# Patient Record
Sex: Female | Born: 1950 | Race: White | Hispanic: No | Marital: Married | Smoking: Never smoker
Health system: Southern US, Community
[De-identification: ages and names within clinical notes are randomized; demographics above are authoritative.]

## PROBLEM LIST (undated history)

## (undated) DIAGNOSIS — I82402 Acute embolism and thrombosis of unspecified deep veins of left lower extremity: Secondary | ICD-10-CM

## (undated) DIAGNOSIS — M79605 Pain in left leg: Secondary | ICD-10-CM

## (undated) DIAGNOSIS — M1990 Unspecified osteoarthritis, unspecified site: Secondary | ICD-10-CM

## (undated) DIAGNOSIS — T8859XA Other complications of anesthesia, initial encounter: Secondary | ICD-10-CM

## (undated) DIAGNOSIS — T4145XA Adverse effect of unspecified anesthetic, initial encounter: Secondary | ICD-10-CM

## (undated) DIAGNOSIS — J189 Pneumonia, unspecified organism: Secondary | ICD-10-CM

## (undated) DIAGNOSIS — I10 Essential (primary) hypertension: Secondary | ICD-10-CM

## (undated) DIAGNOSIS — R7303 Prediabetes: Secondary | ICD-10-CM

## (undated) DIAGNOSIS — E7800 Pure hypercholesterolemia, unspecified: Secondary | ICD-10-CM

## (undated) DIAGNOSIS — R011 Cardiac murmur, unspecified: Secondary | ICD-10-CM

## (undated) HISTORY — DX: Unspecified osteoarthritis, unspecified site: M19.90

## (undated) HISTORY — PX: TUBAL LIGATION: SHX77

## (undated) HISTORY — DX: Cardiac murmur, unspecified: R01.1

## (undated) HISTORY — DX: Pain in left leg: M79.605

## (undated) HISTORY — DX: Prediabetes: R73.03

## (undated) HISTORY — DX: Pure hypercholesterolemia, unspecified: E78.00

## (undated) HISTORY — PX: BACK SURGERY: SHX140

## (undated) HISTORY — DX: Acute embolism and thrombosis of unspecified deep veins of left lower extremity: I82.402

## (undated) HISTORY — DX: Essential (primary) hypertension: I10

---

## 1997-12-18 ENCOUNTER — Ambulatory Visit (HOSPITAL_COMMUNITY): Admission: RE | Admit: 1997-12-18 | Discharge: 1997-12-18 | Payer: Self-pay | Admitting: Emergency Medicine

## 2008-03-10 HISTORY — PX: KNEE SURGERY: SHX244

## 2008-05-23 ENCOUNTER — Emergency Department (HOSPITAL_COMMUNITY): Admission: EM | Admit: 2008-05-23 | Discharge: 2008-05-23 | Payer: Self-pay | Admitting: Emergency Medicine

## 2008-06-08 DIAGNOSIS — I82402 Acute embolism and thrombosis of unspecified deep veins of left lower extremity: Secondary | ICD-10-CM

## 2008-06-08 HISTORY — DX: Acute embolism and thrombosis of unspecified deep veins of left lower extremity: I82.402

## 2008-06-14 ENCOUNTER — Ambulatory Visit (HOSPITAL_BASED_OUTPATIENT_CLINIC_OR_DEPARTMENT_OTHER): Admission: RE | Admit: 2008-06-14 | Discharge: 2008-06-14 | Payer: Self-pay | Admitting: Orthopedic Surgery

## 2008-06-23 ENCOUNTER — Ambulatory Visit: Payer: Self-pay | Admitting: Vascular Surgery

## 2008-06-23 ENCOUNTER — Encounter (INDEPENDENT_AMBULATORY_CARE_PROVIDER_SITE_OTHER): Payer: Self-pay | Admitting: Orthopedic Surgery

## 2008-06-23 ENCOUNTER — Ambulatory Visit (HOSPITAL_COMMUNITY): Admission: RE | Admit: 2008-06-23 | Discharge: 2008-06-23 | Payer: Self-pay | Admitting: Orthopedic Surgery

## 2010-06-19 LAB — BASIC METABOLIC PANEL
CO2: 32 mEq/L (ref 19–32)
Chloride: 103 mEq/L (ref 96–112)
Glucose, Bld: 93 mg/dL (ref 70–99)
Potassium: 3.9 mEq/L (ref 3.5–5.1)
Sodium: 142 mEq/L (ref 135–145)

## 2010-07-23 NOTE — Op Note (Signed)
NAMEHALLI, EQUIHUA             ACCOUNT NO.:  [id number]   MEDICAL RECORD NO.:  [id number]          PATIENT TYPE:  AMB   LOCATION:  DSC                          FACILITY:  [facility]   PHYSICIAN:  Harvie Junior, M.D.   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   POSTOPERATIVE DIAGNOSIS:  1. 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  2. Medial shelf plica.   PRINCIPAL PROCEDURE:  1. Partial posterior horn medial meniscectomy with corresponding      debridement of medial compartment.  2. Chondroplasty of the patellofemoral compartment.  3. Debridement of medial shelf plica.   SURGEON:  Harvie Junior, MD   ASSISTANT:  Marshia Ly, PA   ANESTHESIA:  General.   BRIEF HISTORY:  [name] [name] is a 60 year old female with history of  having had significant pain in the knee.  We treated conservatively for  a period of time.  MRI was obtained which showed she had a classic  posterior horn medial meniscal tear and after discussion of treatment  options, she ultimately was taken to the operating room for operative  knee arthroscopy and debridement of meniscus as needed.   PROCEDURE:  The patient was taken to the operating room.  After adequate  anesthesia was obtained with general anesthetic, the patient was placed  supine on the operating table.  The left leg was then prepped and draped  in usual sterile fashion.  Following this, routine arthroscopic  examination of knee revealed there is an obvious posterior horn medial  meniscal tear, which was debrided with a straight biting forceps back to  smooth and stable rim.  Once, this was completed, attention was turned  to medial femoral condyle which had some grade 2, 3, and 4 change with a  small area of grade 4 change right in the  extension position, right over  the mid body of the meniscus.  Once this was debrided, there was no  fragment and piece of cartilage seen, this was stable.  Attention was  turned to the ACL, normal.  Attention turned to the lateral side,  normal.  I gained entry into the medial compartment, a large fibrous  medial plica had to be taken down.  This was taken down with combination  of biters and suction shavered to really get to that, I think it was to  the cause of some of this rubbing on the medial condyle.  Once this was  completed, attention was turned to the patellofemoral joint which had  midline track and has no hyper pressure and there was certainly some  chondromalacia of the patellofemoral joint which was debrided.  At this  point, the wound was  copiously and thoroughly irrigated and suctioned dry, the arthroscopic  portals were closed with a bandage.  Sterile compressive dressing was  applied.  The patient was taken to the recovery room and was noted to be  in satisfactory condition.  Estimated  blood loss for this procedure was  none.      Harvie Junior, M.D.  Electronically Signed     JLG/MEDQ  D:  06/14/2008  T:  06/15/2008  Job:  [id number]

## 2010-11-26 ENCOUNTER — Other Ambulatory Visit: Payer: Self-pay | Admitting: Internal Medicine

## 2010-11-26 ENCOUNTER — Ambulatory Visit
Admission: RE | Admit: 2010-11-26 | Discharge: 2010-11-26 | Disposition: A | Discharge status: Home / Self Care | Payer: 59 | Source: Ambulatory Visit | Attending: Internal Medicine | Admitting: Internal Medicine

## 2010-11-26 DIAGNOSIS — R1011 Right upper quadrant pain: Secondary | ICD-10-CM

## 2010-11-27 ENCOUNTER — Encounter (INDEPENDENT_AMBULATORY_CARE_PROVIDER_SITE_OTHER): Payer: Self-pay | Admitting: General Surgery

## 2010-11-27 ENCOUNTER — Ambulatory Visit (INDEPENDENT_AMBULATORY_CARE_PROVIDER_SITE_OTHER): Payer: 59 | Admitting: General Surgery

## 2010-11-27 VITALS — BP 138/86 | HR 60 | Temp 97.8°F | Resp 18 | Ht 66.0 in | Wt 237.4 lb

## 2010-11-27 DIAGNOSIS — K8010 Calculus of gallbladder with chronic cholecystitis without obstruction: Secondary | ICD-10-CM

## 2010-11-27 DIAGNOSIS — I10 Essential (primary) hypertension: Secondary | ICD-10-CM

## 2010-11-27 NOTE — Progress Notes (Signed)
Subjective:     Patient ID: [name] [name], female   DOB: [birth year], 60 y.o.   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 Yesterday morning she awoke with pain in the right upper quadrant her abdomen went to the urgent care and I did evaluation in order to ultrasound of the gallbladder that showed several large stones in her gallbladder. She today saw her regular physician and schedule appointment Dr. Shaune Pollack who managed her hypertension and she did send [place] the information that she had on [name] [name].. The ultrasound showed a normal common bile duct definite stones within her gallbladder but no definite acute inflammation or thickening of the gallbladder wall. The patient was not given pain medication when she released since her pain had resolved but says she's had all her previous similar attacks but not quite as intense HPI   Review of Systems Current Outpatient Prescriptions  Medication Sig Dispense Refill  . diltiazem (CARDIZEM CD) 240 MG 24 hr capsule Take 240 mg by mouth daily.        . furosemide (LASIX) 20 MG tablet Take 20 mg by mouth as needed.       Marland Kitchen lisinopril-hydrochlorothiazide (PRINZIDE,ZESTORETIC) 20-25 MG per tablet Take 1 tablet by mouth daily.        . metoprolol (TOPROL-XL) 50 MG 24 hr tablet Take 50 mg by mouth daily.         Past Surgical History  Procedure Date  . Knee surgery 2010    left mcl    No Known Allergies Family history she says her sister has had a cholecystectomy but her mother did not have problems with gallstones her review of systems are essentially negative with the exception of her hypertension. He is mildly overweight.    Objective:   Physical ExamBP 138/86  Pulse 60  Temp 97.8 F (36.6 C)  Resp 18  Ht 5\' 6"  (1.676 m)  Wt 237 lb 6 oz (107.673 kg)  BMI 38.31 kg/m2 Patient is a well-developed Caucasian  female in no acute distress nails she describes the pain in the right upper quadrant that its resolved and did not have any pain radiating up to her upper back hole lower back during this episode. She continues on her antihypertensive medications and she's not tender to palpation of the right quadrant at this time eyes ears nose and throat negative lungs are clear cardiac normal sinus rhythm and a muscle scalpel back problems. He did say when she had the episode of pain she did have a little pain radiating towards her right shoulder     Assessment:    Chronic cholecystitis with stones, patient's coworker is presently off following surgery and the patient understands that low fat small feeding and not eating going to bed his helpful in preventing attacks until after her cholecystectomy. She will talk to the schedule nurse at this time about scheduling surgery in the near future   Plan:     See above

## 2010-11-27 NOTE — Patient Instructions (Signed)
Please limit year of age and saved a small low-fat non-bulky foods do not eat and bed for several hours. You do have the Vicodin iack in the same medicine can be used for postoperative pain management

## 2010-12-20 ENCOUNTER — Other Ambulatory Visit (INDEPENDENT_AMBULATORY_CARE_PROVIDER_SITE_OTHER): Payer: Self-pay | Admitting: General Surgery

## 2010-12-20 ENCOUNTER — Ambulatory Visit (HOSPITAL_COMMUNITY)
Admission: RE | Admit: 2010-12-20 | Discharge: 2010-12-20 | Disposition: A | Discharge status: Home / Self Care | Payer: 59 | Source: Ambulatory Visit | Attending: General Surgery | Admitting: General Surgery

## 2010-12-20 ENCOUNTER — Encounter (HOSPITAL_COMMUNITY): Payer: 59

## 2010-12-20 DIAGNOSIS — Z01818 Encounter for other preprocedural examination: Secondary | ICD-10-CM

## 2010-12-20 DIAGNOSIS — K8020 Calculus of gallbladder without cholecystitis without obstruction: Secondary | ICD-10-CM | POA: Insufficient documentation

## 2010-12-20 DIAGNOSIS — Z01812 Encounter for preprocedural laboratory examination: Secondary | ICD-10-CM | POA: Insufficient documentation

## 2010-12-20 DIAGNOSIS — I10 Essential (primary) hypertension: Secondary | ICD-10-CM | POA: Insufficient documentation

## 2010-12-20 LAB — DIFFERENTIAL
Basophils Relative: 0 % (ref 0–1)
Lymphs Abs: 1.9 10*3/uL (ref 0.7–4.0)
Monocytes Relative: 9 % (ref 3–12)
Neutro Abs: 4.1 10*3/uL (ref 1.7–7.7)
Neutrophils Relative %: 61 % (ref 43–77)

## 2010-12-20 LAB — COMPREHENSIVE METABOLIC PANEL
ALT: 24 U/L (ref 0–35)
CO2: 29 mEq/L (ref 19–32)
Calcium: 9.2 mg/dL (ref 8.4–10.5)
GFR calc Af Amer: >90 mL/min (ref >90)
GFR calc non Af Amer: >90 mL/min (ref >90)
Glucose, Bld: 100 mg/dL — ABNORMAL HIGH (ref 70–99)
Sodium: 142 mEq/L (ref 135–145)

## 2010-12-20 LAB — CBC
Hemoglobin: 12.2 g/dL (ref 12.0–15.0)
MCH: 28.9 pg (ref 26.0–34.0)
MCV: 88.9 fL (ref 78.0–100.0)
RBC: 4.22 MIL/uL (ref 3.87–5.11)

## 2010-12-24 ENCOUNTER — Ambulatory Visit (HOSPITAL_COMMUNITY)
Admission: RE | Admit: 2010-12-24 | Discharge: 2010-12-24 | Disposition: A | Discharge status: Home / Self Care | Payer: 59 | Source: Ambulatory Visit | Attending: General Surgery | Admitting: General Surgery

## 2010-12-24 ENCOUNTER — Other Ambulatory Visit (INDEPENDENT_AMBULATORY_CARE_PROVIDER_SITE_OTHER): Payer: Self-pay | Admitting: General Surgery

## 2010-12-24 ENCOUNTER — Ambulatory Visit (HOSPITAL_COMMUNITY): Payer: 59

## 2010-12-24 DIAGNOSIS — K8010 Calculus of gallbladder with chronic cholecystitis without obstruction: Secondary | ICD-10-CM | POA: Insufficient documentation

## 2010-12-24 DIAGNOSIS — I10 Essential (primary) hypertension: Secondary | ICD-10-CM | POA: Insufficient documentation

## 2010-12-24 DIAGNOSIS — Z01818 Encounter for other preprocedural examination: Secondary | ICD-10-CM | POA: Insufficient documentation

## 2010-12-24 DIAGNOSIS — E669 Obesity, unspecified: Secondary | ICD-10-CM | POA: Insufficient documentation

## 2010-12-24 DIAGNOSIS — Z01812 Encounter for preprocedural laboratory examination: Secondary | ICD-10-CM | POA: Insufficient documentation

## 2010-12-24 HISTORY — PX: CHOLECYSTECTOMY: SHX55

## 2010-12-25 ENCOUNTER — Telehealth (INDEPENDENT_AMBULATORY_CARE_PROVIDER_SITE_OTHER): Payer: Self-pay
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
## 2010-12-25 NOTE — Telephone Encounter (Signed)
C/O face and neck red this morning- eyes swollen. Patient took Vicodin before going to bed last night. NO PROBLEM BREATHING. Patient advised to stop taking Vicodin.  If another pain medicine is needed she will call back.

## 2011-01-08 ENCOUNTER — Ambulatory Visit (INDEPENDENT_AMBULATORY_CARE_PROVIDER_SITE_OTHER): Payer: 59 | Admitting: General Surgery

## 2011-01-08 ENCOUNTER — Encounter (INDEPENDENT_AMBULATORY_CARE_PROVIDER_SITE_OTHER): Payer: Self-pay | Admitting: General Surgery

## 2011-01-08 VITALS — BP 138/90 | HR 80 | Temp 96.4°F | Resp 20 | Ht 66.0 in | Wt 236.4 lb

## 2011-01-08 DIAGNOSIS — K8020 Calculus of gallbladder without cholecystitis without obstruction: Secondary | ICD-10-CM

## 2011-01-08 NOTE — Patient Instructions (Signed)
See [place] prn no dietary restrictions

## 2011-01-08 NOTE — Progress Notes (Signed)
Subjective:     Patient ID: [name] [name], female   DOB: 1950-12-14, 60 y.o.   MRN: [id number]  HPIPatient is now 2 weeks following a laparoscopic cholecystectomy [name] [name] is doing fine scissors a little sore at the naval incision but it's decrease in her wounds are healing evidence of any inflammation. She's aware that she can eat anything she desires but she needs to watch and not over eat  Review of Systems     Objective:   Physical Exam BP 138/90  Pulse 80  Temp(Src) 96.4 F (35.8 C) (Temporal)  Resp 20  Ht 5\' 6"  (1.676 m)  Wt 236 lb 6 oz (107.219 kg)  BMI 38.15 kg/m2 [name] [name] his incisions are healing nicely and she is reports no change in her bowel movements or problems with her diet   Assessment:         Plan:     Back to work last week

## 2011-07-24 ENCOUNTER — Ambulatory Visit (INDEPENDENT_AMBULATORY_CARE_PROVIDER_SITE_OTHER): Payer: 59 | Admitting: Family Medicine

## 2011-07-24 VITALS — BP 163/83 | HR 66 | Temp 99.0°F | Resp 16 | Ht 65.5 in | Wt 240.0 lb

## 2011-07-24 DIAGNOSIS — J209 Acute bronchitis, unspecified: Secondary | ICD-10-CM

## 2011-07-24 DIAGNOSIS — J069 Acute upper respiratory infection, unspecified: Secondary | ICD-10-CM

## 2011-07-24 DIAGNOSIS — J208 Acute bronchitis due to other specified organisms: Secondary | ICD-10-CM

## 2011-07-24 MED ORDER — HYDROCODONE-HOMATROPINE 5-1.5 MG/5ML PO SYRP: 5.0000 mL | ORAL_SOLUTION | Freq: Three times a day (TID) | ORAL | Status: AC | PRN

## 2011-07-24 NOTE — Assessment & Plan Note (Signed)
Discussed supportive care and infectious red flags. Handout given. Overall stable resp status. Will follow as needed.

## 2011-07-24 NOTE — Patient Instructions (Signed)

## 2011-07-24 NOTE — Progress Notes (Signed)
  Subjective:    Patient ID: [name] [name], female    DOB: [birth year], 61 y.o.   MRN: [id number]  HPI URI Symptoms Onset: 2 days  Description: mild drainage, cough, congestion Modifying factors:  none  Symptoms Nasal discharge: minimal  Fever: no Sore throat: no Cough: yes Wheezing: no Ear pain: no GI symptoms: no Sick contacts: yes; grandson with similar sxs   Red Flags  Stiff neck: no Dyspnea: no Rash: no Swallowing difficulty: no  Sinusitis Risk Factors Headache/face pain: no Double sickening: no tooth pain: no  Allergy Risk Factors Sneezing: no Itchy scratchy throat: no Seasonal symptoms: no  Flu Risk Factors Headache: no muscle aches: no severe fatigue: no     Review of Systems See HPI, otherwise ROS negative     Objective:   Physical Exam Gen: up in chair, NAD HEENT: NCAT, EOMI, TMs clear bilaterally, +nasal erythema, rhinorrhea bilaterally, + post oropharyngeal erythema  CV: RRR, no murmurs auscultated PULM: CTAB, no wheezes, rales, rhoncii ABD: S/NT/+ bowel sounds  EXT: 2+ peripheral pulses          Assessment & Plan:

## 2011-07-28 NOTE — Progress Notes (Signed)
  Subjective:    Patient ID: [name] [name], female    DOB: 04/07/1950, 61 y.o.   MRN: [id number]  HPI    Review of Systems     Objective:   Physical Exam        Assessment & Plan:  Patient precepted with Dr. Alvester Morin.  AGree with his assessment and plan.

## 2013-06-14 ENCOUNTER — Other Ambulatory Visit (HOSPITAL_COMMUNITY): Payer: 59

## 2013-07-08 ENCOUNTER — Encounter: Payer: Self-pay | Admitting: Internal Medicine

## 2013-07-08 ENCOUNTER — Other Ambulatory Visit (HOSPITAL_COMMUNITY): Payer: Self-pay | Admitting: Family Medicine

## 2013-07-08 ENCOUNTER — Ambulatory Visit (HOSPITAL_COMMUNITY): Payer: 59 | Attending: Internal Medicine | Admitting: Radiology

## 2013-07-08 DIAGNOSIS — R011 Cardiac murmur, unspecified: Secondary | ICD-10-CM

## 2013-07-08 NOTE — Progress Notes (Signed)
Echocardiogram performed.  

## 2016-08-01 ENCOUNTER — Ambulatory Visit (INDEPENDENT_AMBULATORY_CARE_PROVIDER_SITE_OTHER): Payer: 59 | Admitting: Family Medicine

## 2016-08-01 ENCOUNTER — Encounter: Payer: Self-pay | Admitting: Family Medicine

## 2016-08-01 ENCOUNTER — Ambulatory Visit (INDEPENDENT_AMBULATORY_CARE_PROVIDER_SITE_OTHER): Payer: 59

## 2016-08-01 VITALS — BP 171/77 | HR 70 | Temp 98.8°F | Resp 17 | Ht 65.5 in | Wt 231.0 lb

## 2016-08-01 DIAGNOSIS — M47818 Spondylosis without myelopathy or radiculopathy, sacral and sacrococcygeal region: Secondary | ICD-10-CM

## 2016-08-01 DIAGNOSIS — M4698 Unspecified inflammatory spondylopathy, sacral and sacrococcygeal region: Secondary | ICD-10-CM

## 2016-08-01 DIAGNOSIS — M25552 Pain in left hip: Secondary | ICD-10-CM

## 2016-08-01 MED ORDER — DICLOFENAC SODIUM 75 MG PO TBEC: 75.0000 mg | DELAYED_RELEASE_TABLET | Freq: Two times a day (BID) | ORAL | 1 refills | Status: DC
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
## 2016-10-28 ENCOUNTER — Ambulatory Visit: Payer: Self-pay | Admitting: Orthopedic Surgery

## 2016-10-28 NOTE — Progress Notes (Signed)
Need orders asap in epic for 8-24 surgery

## 2016-10-28 NOTE — Patient Instructions (Signed)
[name] [name]  10/28/2016   Your procedure is scheduled on: 10/31/2016   Report to [facility] Main  Entrance Take [place]  elevators to 3rd floor to  Short Stay Center at    1100AM.    Call this number if you have problems the morning of surgery [phone]    Remember: ONLY 1 PERSON MAY GO WITH YOU TO SHORT STAY TO GET  READY MORNING OF YOUR SURGERY.  Do not eat food or drink liquids :After Midnight.     Take these medicines the morning of surgery with A SIP OF WATER: Cardizem, Toprol                                 You may not have any metal on your body including hair pins and              piercings  Do not wear jewelry, make-up, lotions, powders or perfumes, deodorant             Do not wear nail polish.  Do not shave  48 hours prior to surgery.                Do not bring valuables to the hospital. [facility] IS NOT             RESPONSIBLE   FOR VALUABLES.  Contacts, dentures or bridgework may not be worn into surgery.  Leave suitcase in the car. After surgery it may be brought to your room.                       Please read over the following fact sheets you were given: _____________________________________________________________________             [facility] - Preparing for Surgery Before surgery, you can play an important role.  Because skin is not sterile, your skin needs to be as free of germs as possible.  You can reduce the number of germs on your skin by washing with CHG (chlorahexidine gluconate) soap before surgery.  CHG is an antiseptic cleaner which kills germs and bonds with the skin to continue killing germs even after washing. Please DO NOT use if you have an allergy to CHG or antibacterial soaps.  If your skin becomes reddened/irritated stop using the CHG and inform your nurse when you arrive at Short Stay. Do not shave (including legs and underarms) for at least 48 hours prior to the first CHG shower.  You may shave your  face/neck. Please follow these instructions carefully:  1.  Shower with CHG Soap the night before surgery and the  morning of Surgery.  2.  If you choose to wash your hair, wash your hair first as usual with your  normal  shampoo.  3.  After you shampoo, rinse your hair and body thoroughly to remove the  shampoo.                           4.  Use CHG as you would any other liquid soap.  You can apply chg directly  to the skin and wash                       Gently with a scrungie or clean washcloth.  5.  Apply the CHG Soap to your body ONLY FROM THE NECK DOWN.   Do not use on face/ open                           Wound or open sores. Avoid contact with eyes, ears mouth and genitals (private parts).                       Wash face,  Genitals (private parts) with your normal soap.             6.  Wash thoroughly, paying special attention to the area where your surgery  will be performed.  7.  Thoroughly rinse your body with warm water from the neck down.  8.  DO NOT shower/wash with your normal soap after using and rinsing off  the CHG Soap.                9.  Pat yourself dry with a clean towel.            10.  Wear clean pajamas.            11.  Place clean sheets on your bed the night of your first shower and do not  sleep with pets. Day of Surgery : Do not apply any lotions/deodorants the morning of surgery.  Please wear clean clothes to the hospital/surgery center.  FAILURE TO FOLLOW THESE INSTRUCTIONS MAY RESULT IN THE CANCELLATION OF YOUR SURGERY PATIENT SIGNATURE_________________________________  NURSE SIGNATURE__________________________________  ________________________________________________________________________   [name] [name]  An incentive spirometer is a tool that can help keep your lungs clear and active. This tool measures how well you are filling your lungs with each breath. Taking long deep breaths may help reverse or decrease the chance of developing breathing  (pulmonary) problems (especially infection) following:  A long period of time when you are unable to move or be active. BEFORE THE PROCEDURE   If the spirometer includes an indicator to show your best effort, your nurse or respiratory therapist will set it to a desired goal.  If possible, sit up straight or lean slightly forward. Try not to slouch.  Hold the incentive spirometer in an upright position. INSTRUCTIONS FOR USE  1. Sit on the edge of your bed if possible, or sit up as far as you can in bed or on a chair. 2. Hold the incentive spirometer in an upright position. 3. Breathe out normally. 4. Place the mouthpiece in your mouth and seal your lips tightly around it. 5. Breathe in slowly and as deeply as possible, raising the piston or the ball toward the top of the column. 6. Hold your breath for 3-5 seconds or for as long as possible. Allow the piston or ball to fall to the bottom of the column. 7. Remove the mouthpiece from your mouth and breathe out normally. 8. Rest for a few seconds and repeat Steps 1 through 7 at least 10 times every 1-2 hours when you are awake. Take your time and take a few normal breaths between deep breaths. 9. The spirometer may include an indicator to show your best effort. Use the indicator as a goal to work toward during each repetition. 10. After each set of 10 deep breaths, practice coughing to be sure your lungs are clear. If you have an incision (the cut made at the time of surgery), support your incision when coughing by placing a  pillow or rolled up towels firmly against it. Once you are able to get out of bed, walk around indoors and cough well. You may stop using the incentive spirometer when instructed by your caregiver.  RISKS AND COMPLICATIONS  Take your time so you do not get dizzy or light-headed.  If you are in pain, you may need to take or ask for pain medication before doing incentive spirometry. It is harder to take a deep breath if you  are having pain. AFTER USE  Rest and breathe slowly and easily.  It can be helpful to keep track of a log of your progress. Your caregiver can provide you with a simple table to help with this. If you are using the spirometer at home, follow these instructions: [place] IF:   You are having difficultly using the spirometer.  You have trouble using the spirometer as often as instructed.  Your pain medication is not giving enough relief while using the spirometer.  You develop fever of 100.5 F (38.1 C) or higher. SEEK IMMEDIATE MEDICAL CARE IF:   You cough up bloody sputum that had not been present before.  You develop fever of 102 F (38.9 C) or greater.  You develop worsening pain at or near the incision site. MAKE SURE YOU:   Understand these instructions.  Will watch your condition.  Will get help right away if you are not doing well or get worse. Document Released: 07/07/2006 Document Revised: 05/19/2011 Document Reviewed: 09/07/2006 [facility] Patient Information 2014 [place], [place].   ________________________________________________________________________

## 2016-10-29 ENCOUNTER — Encounter (HOSPITAL_COMMUNITY)
Admission: RE | Admit: 2016-10-29 | Discharge: 2016-10-29 | Disposition: A | Discharge status: Home / Self Care | Payer: 59 | Source: Ambulatory Visit | Attending: Specialist | Admitting: Specialist

## 2016-10-29 ENCOUNTER — Ambulatory Visit (HOSPITAL_COMMUNITY)
Admission: RE | Admit: 2016-10-29 | Discharge: 2016-10-29 | Disposition: A | Discharge status: Home / Self Care | Payer: 59 | Source: Ambulatory Visit | Attending: Orthopedic Surgery | Admitting: Orthopedic Surgery

## 2016-10-29 ENCOUNTER — Encounter (HOSPITAL_COMMUNITY): Payer: Self-pay

## 2016-10-29 DIAGNOSIS — Z01812 Encounter for preprocedural laboratory examination: Secondary | ICD-10-CM | POA: Diagnosis present

## 2016-10-29 DIAGNOSIS — Z01810 Encounter for preprocedural cardiovascular examination: Secondary | ICD-10-CM | POA: Diagnosis present

## 2016-10-29 DIAGNOSIS — Z01818 Encounter for other preprocedural examination: Secondary | ICD-10-CM | POA: Diagnosis not present

## 2016-10-29 DIAGNOSIS — M5136 Other intervertebral disc degeneration, lumbar region: Secondary | ICD-10-CM | POA: Insufficient documentation

## 2016-10-29 DIAGNOSIS — M5126 Other intervertebral disc displacement, lumbar region: Secondary | ICD-10-CM

## 2016-10-29 LAB — CBC
HEMATOCRIT: 38.6 % (ref 36.0–46.0)
Hemoglobin: 12.7 g/dL (ref 12.0–15.0)
MCH: 29.3 pg (ref 26.0–34.0)
MCHC: 32.9 g/dL (ref 30.0–36.0)
MCV: 89.1 fL (ref 78.0–100.0)
Platelets: 287 10*3/uL (ref 150–400)
RBC: 4.33 MIL/uL (ref 3.87–5.11)
RDW: 14.2 % (ref 11.5–15.5)
WBC: 8.6 10*3/uL (ref 4.0–10.5)

## 2016-10-29 LAB — SURGICAL PCR SCREEN
MRSA, PCR: NEGATIVE
Staphylococcus aureus: NEGATIVE

## 2016-10-30 NOTE — Progress Notes (Signed)
Clearance dated 10/29/16 on chart fron Dr Lupita Leash gates and CMP and [id number] done 10/29/16 on chart.

## 2016-10-31 ENCOUNTER — Ambulatory Visit (HOSPITAL_COMMUNITY): Payer: 59

## 2016-10-31 ENCOUNTER — Encounter (HOSPITAL_COMMUNITY): Payer: Self-pay | Admitting: *Deleted

## 2016-10-31 ENCOUNTER — Ambulatory Visit (HOSPITAL_COMMUNITY): Payer: 59 | Admitting: Anesthesiology

## 2016-10-31 ENCOUNTER — Encounter (HOSPITAL_COMMUNITY)
Admission: RE | Disposition: A | Discharge status: Home / Self Care | Payer: Self-pay | Source: Ambulatory Visit | Attending: Specialist

## 2016-10-31 ENCOUNTER — Ambulatory Visit (HOSPITAL_COMMUNITY)
Admission: RE | Admit: 2016-10-31 | Discharge: 2016-11-01 | Disposition: A | Discharge status: Home / Self Care | Payer: 59 | Source: Ambulatory Visit | Attending: Specialist | Admitting: Specialist

## 2016-10-31 DIAGNOSIS — M5136 Other intervertebral disc degeneration, lumbar region: Secondary | ICD-10-CM | POA: Diagnosis present

## 2016-10-31 DIAGNOSIS — M5116 Intervertebral disc disorders with radiculopathy, lumbar region: Secondary | ICD-10-CM | POA: Insufficient documentation

## 2016-10-31 DIAGNOSIS — R011 Cardiac murmur, unspecified: Secondary | ICD-10-CM | POA: Diagnosis not present

## 2016-10-31 DIAGNOSIS — Z86718 Personal history of other venous thrombosis and embolism: Secondary | ICD-10-CM | POA: Insufficient documentation

## 2016-10-31 DIAGNOSIS — M25552 Pain in left hip: Secondary | ICD-10-CM

## 2016-10-31 DIAGNOSIS — M1990 Unspecified osteoarthritis, unspecified site: Secondary | ICD-10-CM | POA: Insufficient documentation

## 2016-10-31 DIAGNOSIS — M48062 Spinal stenosis, lumbar region with neurogenic claudication: Secondary | ICD-10-CM | POA: Insufficient documentation

## 2016-10-31 DIAGNOSIS — Z9049 Acquired absence of other specified parts of digestive tract: Secondary | ICD-10-CM | POA: Insufficient documentation

## 2016-10-31 DIAGNOSIS — I10 Essential (primary) hypertension: Secondary | ICD-10-CM | POA: Insufficient documentation

## 2016-10-31 DIAGNOSIS — Z79899 Other long term (current) drug therapy: Secondary | ICD-10-CM | POA: Insufficient documentation

## 2016-10-31 DIAGNOSIS — Z419 Encounter for procedure for purposes other than remedying health state, unspecified: Secondary | ICD-10-CM

## 2016-10-31 DIAGNOSIS — M51369 Other intervertebral disc degeneration, lumbar region without mention of lumbar back pain or lower extremity pain: Secondary | ICD-10-CM | POA: Diagnosis present

## 2016-10-31 DIAGNOSIS — M47818 Spondylosis without myelopathy or radiculopathy, sacral and sacrococcygeal region: Secondary | ICD-10-CM

## 2016-10-31 HISTORY — PX: LUMBAR LAMINECTOMY/DECOMPRESSION MICRODISCECTOMY: SHX5026

## 2016-10-31 SURGERY — LUMBAR LAMINECTOMY/DECOMPRESSION MICRODISCECTOMY 1 LEVEL
Anesthesia: General | Site: Back | Laterality: Left

## 2016-10-31 MED ORDER — ONDANSETRON HCL 4 MG PO TABS: 4.0000 mg | ORAL_TABLET | Freq: Four times a day (QID) | ORAL | Status: DC | PRN

## 2016-10-31 MED ORDER — OXYCODONE-ACETAMINOPHEN 5-325 MG PO TABS: 1.0000 | ORAL_TABLET | ORAL | 0 refills | Status: DC | PRN

## 2016-10-31 MED ORDER — METHOCARBAMOL 1000 MG/10ML IJ SOLN
500.0000 mg | Freq: Four times a day (QID) | INTRAVENOUS | Status: DC | PRN
  Administered 2016-10-31: 500 mg via INTRAVENOUS
  Filled 2016-10-31: qty 550

## 2016-10-31 MED ORDER — FUROSEMIDE 20 MG PO TABS: 20.0000 mg | ORAL_TABLET | Freq: Every day | ORAL | Status: DC | PRN

## 2016-10-31 MED ORDER — POLYETHYLENE GLYCOL 3350 17 G PO PACK: 17.0000 g | PACK | Freq: Every day | ORAL | Status: DC | PRN

## 2016-10-31 MED ORDER — FENTANYL CITRATE (PF) 250 MCG/5ML IJ SOLN
INTRAMUSCULAR | Status: AC
  Filled 2016-10-31: qty 5

## 2016-10-31 MED ORDER — LIDOCAINE 2% (20 MG/ML) 5 ML SYRINGE
INTRAMUSCULAR | Status: AC
  Filled 2016-10-31: qty 5

## 2016-10-31 MED ORDER — MENTHOL 3 MG MT LOZG: 1.0000 | LOZENGE | OROMUCOSAL | Status: DC | PRN

## 2016-10-31 MED ORDER — FENTANYL CITRATE (PF) 100 MCG/2ML IJ SOLN
INTRAMUSCULAR | Status: DC | PRN
  Administered 2016-10-31 (×5): 50 ug via INTRAVENOUS

## 2016-10-31 MED ORDER — BUPIVACAINE-EPINEPHRINE 0.5% -1:200000 IJ SOLN
INTRAMUSCULAR | Status: DC | PRN
  Administered 2016-10-31: 40 mL

## 2016-10-31 MED ORDER — SUGAMMADEX SODIUM 200 MG/2ML IV SOLN
INTRAVENOUS | Status: DC | PRN
  Administered 2016-10-31: 364.8 mg via INTRAVENOUS

## 2016-10-31 MED ORDER — BISACODYL 5 MG PO TBEC: 5.0000 mg | DELAYED_RELEASE_TABLET | Freq: Every day | ORAL | Status: DC | PRN

## 2016-10-31 MED ORDER — DICLOFENAC SODIUM 75 MG PO TBEC: 75.0000 mg | DELAYED_RELEASE_TABLET | Freq: Two times a day (BID) | ORAL | 1 refills | Status: DC

## 2016-10-31 MED ORDER — BUPIVACAINE-EPINEPHRINE 0.5% -1:200000 IJ SOLN
INTRAMUSCULAR | Status: AC
  Filled 2016-10-31: qty 1

## 2016-10-31 MED ORDER — DEXAMETHASONE SODIUM PHOSPHATE 4 MG/ML IJ SOLN
INTRAMUSCULAR | Status: DC | PRN
  Administered 2016-10-31: 10 mg via INTRAVENOUS

## 2016-10-31 MED ORDER — ONDANSETRON HCL 4 MG/2ML IJ SOLN: 4.0000 mg | Freq: Once | INTRAMUSCULAR | Status: DC | PRN

## 2016-10-31 MED ORDER — FENTANYL CITRATE (PF) 100 MCG/2ML IJ SOLN
INTRAMUSCULAR | Status: AC
  Filled 2016-10-31: qty 2

## 2016-10-31 MED ORDER — DOCUSATE SODIUM 100 MG PO CAPS
100.0000 mg | ORAL_CAPSULE | Freq: Two times a day (BID) | ORAL | Status: DC
  Administered 2016-10-31 – 2016-11-01 (×2): 100 mg via ORAL
  Filled 2016-10-31 (×2): qty 1

## 2016-10-31 MED ORDER — ACETAMINOPHEN 325 MG PO TABS
650.0000 mg | ORAL_TABLET | ORAL | Status: DC | PRN
  Administered 2016-11-01: 650 mg via ORAL
  Filled 2016-10-31: qty 2

## 2016-10-31 MED ORDER — PHENOL 1.4 % MT LIQD
1.0000 | OROMUCOSAL | Status: DC | PRN
  Filled 2016-10-31: qty 177

## 2016-10-31 MED ORDER — ONDANSETRON HCL 4 MG/2ML IJ SOLN
INTRAMUSCULAR | Status: AC
Start: 2016-10-31 — End: 2016-10-31
  Filled 2016-10-31: qty 2

## 2016-10-31 MED ORDER — MEPERIDINE HCL 50 MG/ML IJ SOLN: 6.2500 mg | INTRAMUSCULAR | Status: DC | PRN

## 2016-10-31 MED ORDER — PROPOFOL 10 MG/ML IV BOLUS
INTRAVENOUS | Status: DC | PRN
  Administered 2016-10-31: 150 mg via INTRAVENOUS

## 2016-10-31 MED ORDER — LIDOCAINE 2% (20 MG/ML) 5 ML SYRINGE
INTRAMUSCULAR | Status: DC | PRN
  Administered 2016-10-31: 50 mg via INTRAVENOUS

## 2016-10-31 MED ORDER — LACTATED RINGERS IV SOLN
INTRAVENOUS | Status: DC
  Administered 2016-10-31 (×2): via INTRAVENOUS

## 2016-10-31 MED ORDER — THROMBIN 5000 UNITS EX SOLR
CUTANEOUS | Status: DC | PRN
  Administered 2016-10-31: 10000 [IU] via TOPICAL

## 2016-10-31 MED ORDER — METHOCARBAMOL 500 MG PO TABS
500.0000 mg | ORAL_TABLET | Freq: Four times a day (QID) | ORAL | Status: DC | PRN
  Administered 2016-10-31 – 2016-11-01 (×2): 500 mg via ORAL
  Filled 2016-10-31 (×2): qty 1

## 2016-10-31 MED ORDER — POTASSIUM CHLORIDE CRYS ER 10 MEQ PO TBCR
10.0000 meq | EXTENDED_RELEASE_TABLET | Freq: Two times a day (BID) | ORAL | Status: DC
  Administered 2016-10-31 – 2016-11-01 (×2): 10 meq via ORAL
  Filled 2016-10-31 (×2): qty 1

## 2016-10-31 MED ORDER — MAGNESIUM CITRATE PO SOLN: 1.0000 | Freq: Once | ORAL | Status: DC | PRN

## 2016-10-31 MED ORDER — EPHEDRINE SULFATE-NACL 50-0.9 MG/10ML-% IV SOSY
PREFILLED_SYRINGE | INTRAVENOUS | Status: DC | PRN
  Administered 2016-10-31: 10 mg via INTRAVENOUS

## 2016-10-31 MED ORDER — METOPROLOL SUCCINATE ER 50 MG PO TB24
150.0000 mg | ORAL_TABLET | Freq: Every day | ORAL | Status: DC
  Administered 2016-11-01: 150 mg via ORAL
  Filled 2016-10-31: qty 3

## 2016-10-31 MED ORDER — ONDANSETRON HCL 4 MG/2ML IJ SOLN: 4.0000 mg | Freq: Four times a day (QID) | INTRAMUSCULAR | Status: DC | PRN

## 2016-10-31 MED ORDER — POLYMYXIN B SULFATE 500000 UNITS IJ SOLR
INTRAMUSCULAR | Status: AC
  Filled 2016-10-31: qty 500000

## 2016-10-31 MED ORDER — HYDROMORPHONE HCL-NACL 0.5-0.9 MG/ML-% IV SOSY: 0.5000 mg | PREFILLED_SYRINGE | INTRAVENOUS | Status: DC | PRN

## 2016-10-31 MED ORDER — THROMBIN 5000 UNITS EX SOLR
CUTANEOUS | Status: AC
  Filled 2016-10-31: qty 10000

## 2016-10-31 MED ORDER — PHENYLEPHRINE HCL 10 MG/ML IJ SOLN
INTRAMUSCULAR | Status: DC | PRN
  Administered 2016-10-31: 50 ug/min via INTRAVENOUS

## 2016-10-31 MED ORDER — MIDAZOLAM HCL 5 MG/5ML IJ SOLN
INTRAMUSCULAR | Status: DC | PRN
  Administered 2016-10-31: 2 mg via INTRAVENOUS

## 2016-10-31 MED ORDER — PROPOFOL 10 MG/ML IV BOLUS
INTRAVENOUS | Status: AC
  Filled 2016-10-31: qty 20

## 2016-10-31 MED ORDER — ROCURONIUM BROMIDE 50 MG/5ML IV SOSY
PREFILLED_SYRINGE | INTRAVENOUS | Status: DC | PRN
  Administered 2016-10-31: 50 mg via INTRAVENOUS
  Administered 2016-10-31: 10 mg via INTRAVENOUS

## 2016-10-31 MED ORDER — ONDANSETRON HCL 4 MG/2ML IJ SOLN
INTRAMUSCULAR | Status: DC | PRN
Start: 2016-10-31 — End: 2016-10-31
  Administered 2016-10-31: 4 mg via INTRAVENOUS

## 2016-10-31 MED ORDER — METOCLOPRAMIDE HCL 5 MG/ML IJ SOLN
INTRAMUSCULAR | Status: DC | PRN
  Administered 2016-10-31: 10 mg via INTRAVENOUS

## 2016-10-31 MED ORDER — CEFAZOLIN SODIUM-DEXTROSE 2-4 GM/100ML-% IV SOLN
2.0000 g | Freq: Three times a day (TID) | INTRAVENOUS | Status: AC
  Administered 2016-10-31 – 2016-11-01 (×2): 2 g via INTRAVENOUS
  Filled 2016-10-31 (×2): qty 100

## 2016-10-31 MED ORDER — KCL IN DEXTROSE-NACL 20-5-0.45 MEQ/L-%-% IV SOLN
INTRAVENOUS | Status: DC
  Administered 2016-10-31: 21:00:00 via INTRAVENOUS
  Filled 2016-10-31: qty 1000

## 2016-10-31 MED ORDER — IRBESARTAN 75 MG PO TABS
37.5000 mg | ORAL_TABLET | Freq: Every day | ORAL | Status: DC
  Administered 2016-11-01: 08:00:00 37.5 mg via ORAL
  Filled 2016-10-31: qty 1

## 2016-10-31 MED ORDER — ROCURONIUM BROMIDE 50 MG/5ML IV SOSY
PREFILLED_SYRINGE | INTRAVENOUS | Status: AC
  Filled 2016-10-31: qty 10

## 2016-10-31 MED ORDER — CEFAZOLIN SODIUM-DEXTROSE 2-4 GM/100ML-% IV SOLN
2.0000 g | INTRAVENOUS | Status: AC
  Administered 2016-10-31: 2 g via INTRAVENOUS
  Filled 2016-10-31: qty 100

## 2016-10-31 MED ORDER — POLYETHYLENE GLYCOL 3350 17 G PO PACK: 17.0000 g | PACK | Freq: Every day | ORAL | 0 refills | Status: DC

## 2016-10-31 MED ORDER — FENTANYL CITRATE (PF) 100 MCG/2ML IJ SOLN
25.0000 ug | INTRAMUSCULAR | Status: DC | PRN
  Administered 2016-10-31 (×3): 50 ug via INTRAVENOUS

## 2016-10-31 MED ORDER — METOCLOPRAMIDE HCL 5 MG/ML IJ SOLN
INTRAMUSCULAR | Status: AC
  Filled 2016-10-31: qty 2

## 2016-10-31 MED ORDER — DOCUSATE SODIUM 100 MG PO CAPS: 100.0000 mg | ORAL_CAPSULE | Freq: Two times a day (BID) | ORAL | 1 refills | Status: AC | PRN

## 2016-10-31 MED ORDER — PHENYLEPHRINE 40 MCG/ML (10ML) SYRINGE FOR IV PUSH (FOR BLOOD PRESSURE SUPPORT)
PREFILLED_SYRINGE | INTRAVENOUS | Status: DC | PRN
  Administered 2016-10-31: 40 ug via INTRAVENOUS
  Administered 2016-10-31: 80 ug via INTRAVENOUS

## 2016-10-31 MED ORDER — OXYCODONE HCL 5 MG PO TABS
5.0000 mg | ORAL_TABLET | ORAL | Status: DC | PRN
  Administered 2016-10-31 – 2016-11-01 (×4): 10 mg via ORAL
  Filled 2016-10-31: qty 1
  Filled 2016-10-31 (×2): qty 2
  Filled 2016-10-31: qty 1
  Filled 2016-10-31: qty 2

## 2016-10-31 MED ORDER — PHENYLEPHRINE HCL 10 MG/ML IJ SOLN
INTRAMUSCULAR | Status: AC
  Filled 2016-10-31: qty 1

## 2016-10-31 MED ORDER — SODIUM CHLORIDE 0.9 % IV SOLN
INTRAVENOUS | Status: DC | PRN
  Administered 2016-10-31: 500 mL

## 2016-10-31 MED ORDER — ACETAMINOPHEN 650 MG RE SUPP: 650.0000 mg | RECTAL | Status: DC | PRN

## 2016-10-31 MED ORDER — DILTIAZEM HCL ER COATED BEADS 240 MG PO CP24
240.0000 mg | ORAL_CAPSULE | Freq: Every day | ORAL | Status: DC
  Administered 2016-11-01: 240 mg via ORAL
  Filled 2016-10-31: qty 1

## 2016-10-31 MED ORDER — EPHEDRINE 5 MG/ML INJ
INTRAVENOUS | Status: AC
  Filled 2016-10-31: qty 10

## 2016-10-31 MED ORDER — RISAQUAD PO CAPS
1.0000 | ORAL_CAPSULE | Freq: Every day | ORAL | Status: DC
  Administered 2016-11-01: 1 via ORAL
  Filled 2016-10-31: qty 1

## 2016-10-31 MED ORDER — ACETAMINOPHEN 10 MG/ML IV SOLN
1000.0000 mg | INTRAVENOUS | Status: AC
  Administered 2016-10-31: 1000 mg via INTRAVENOUS
  Filled 2016-10-31: qty 100

## 2016-10-31 MED ORDER — ALUM & MAG HYDROXIDE-SIMETH 200-200-20 MG/5ML PO SUSP: 30.0000 mL | Freq: Four times a day (QID) | ORAL | Status: DC | PRN

## 2016-10-31 MED ORDER — DEXAMETHASONE SODIUM PHOSPHATE 10 MG/ML IJ SOLN
INTRAMUSCULAR | Status: AC
  Filled 2016-10-31: qty 1

## 2016-10-31 MED ORDER — PHENYLEPHRINE 40 MCG/ML (10ML) SYRINGE FOR IV PUSH (FOR BLOOD PRESSURE SUPPORT)
PREFILLED_SYRINGE | INTRAVENOUS | Status: AC
  Filled 2016-10-31: qty 10

## 2016-10-31 MED ORDER — MIDAZOLAM HCL 2 MG/2ML IJ SOLN
INTRAMUSCULAR | Status: AC
  Filled 2016-10-31: qty 2

## 2016-10-31 SURGICAL SUPPLY — 48 items
BAG ZIPLOCK 12X15 (MISCELLANEOUS) IMPLANT
CLEANER TIP ELECTROSURG 2X2 (MISCELLANEOUS) ×3 IMPLANT
CLOSURE WOUND 1/2 X4 (GAUZE/BANDAGES/DRESSINGS) ×1
CLOTH 2% CHLOROHEXIDINE 3PK (PERSONAL CARE ITEMS) ×3 IMPLANT
COVER SURGICAL LIGHT HANDLE (MISCELLANEOUS) ×3 IMPLANT
DRAPE MICROSCOPE LEICA (MISCELLANEOUS) ×3 IMPLANT
DRAPE POUCH INSTRU U-SHP 10X18 (DRAPES) ×3 IMPLANT
DRAPE SHEET LG 3/4 BI-LAMINATE (DRAPES) ×3 IMPLANT
DRAPE SURG 17X11 SM STRL (DRAPES) ×3 IMPLANT
DRAPE UTILITY XL STRL (DRAPES) ×3 IMPLANT
DRSG AQUACEL AG ADV 3.5X 4 (GAUZE/BANDAGES/DRESSINGS) ×3 IMPLANT
DRSG AQUACEL AG ADV 3.5X 6 (GAUZE/BANDAGES/DRESSINGS) IMPLANT
DURAPREP 26ML APPLICATOR (WOUND CARE) ×3 IMPLANT
DURASEAL SPINE SEALANT 3ML (MISCELLANEOUS) IMPLANT
ELECT BLADE TIP CTD 4 INCH (ELECTRODE) IMPLANT
ELECT REM PT RETURN 15FT ADLT (MISCELLANEOUS) ×3 IMPLANT
GLOVE BIOGEL PI IND STRL 7.0 (GLOVE) ×1 IMPLANT
GLOVE BIOGEL PI INDICATOR 7.0 (GLOVE) ×2
GLOVE SURG SS PI 7.0 STRL IVOR (GLOVE) ×3 IMPLANT
GLOVE SURG SS PI 7.5 STRL IVOR (GLOVE) ×3 IMPLANT
GLOVE SURG SS PI 8.0 STRL IVOR (GLOVE) ×6 IMPLANT
GOWN STRL REUS W/TWL XL LVL3 (GOWN DISPOSABLE) ×6 IMPLANT
HEMOSTAT SPONGE AVITENE ULTRA (HEMOSTASIS) IMPLANT
IV CATH 14GX2 1/4 (CATHETERS) ×3 IMPLANT
KIT BASIN OR (CUSTOM PROCEDURE TRAY) ×3 IMPLANT
KIT POSITIONING SURG ANDREWS (MISCELLANEOUS) ×3 IMPLANT
MANIFOLD NEPTUNE II (INSTRUMENTS) ×3 IMPLANT
NEEDLE SPNL 18GX3.5 QUINCKE PK (NEEDLE) ×6 IMPLANT
PACK LAMINECTOMY ORTHO (CUSTOM PROCEDURE TRAY) ×3 IMPLANT
PATTIES SURGICAL .5 X.5 (GAUZE/BANDAGES/DRESSINGS) IMPLANT
PATTIES SURGICAL .75X.75 (GAUZE/BANDAGES/DRESSINGS) IMPLANT
PATTIES SURGICAL 1X1 (DISPOSABLE) IMPLANT
RUBBERBAND STERILE (MISCELLANEOUS) ×6 IMPLANT
SPONGE SURGIFOAM ABS GEL 100 (HEMOSTASIS) ×3 IMPLANT
STAPLER VISISTAT (STAPLE) IMPLANT
STRIP CLOSURE SKIN 1/2X4 (GAUZE/BANDAGES/DRESSINGS) ×2 IMPLANT
SUT NURALON 4 0 TR CR/8 (SUTURE) IMPLANT
SUT PROLENE 3 0 PS 2 (SUTURE) ×3 IMPLANT
SUT VIC AB 1 CT1 27 (SUTURE)
SUT VIC AB 1 CT1 27XBRD ANTBC (SUTURE) IMPLANT
SUT VIC AB 1-0 CT2 27 (SUTURE) ×3 IMPLANT
SUT VIC AB 2-0 CT1 27 (SUTURE)
SUT VIC AB 2-0 CT1 TAPERPNT 27 (SUTURE) IMPLANT
SUT VIC AB 2-0 CT2 27 (SUTURE) ×3 IMPLANT
SYR 3ML LL SCALE MARK (SYRINGE) IMPLANT
TOWEL OR 17X26 10 PK STRL BLUE (TOWEL DISPOSABLE) ×3 IMPLANT
TOWEL OR NON WOVEN STRL DISP B (DISPOSABLE) IMPLANT
YANKAUER SUCT BULB TIP NO VENT (SUCTIONS) IMPLANT

## 2016-10-31 NOTE — Discharge Instructions (Signed)
Walk As Tolerated utilizing back precautions.  No bending, twisting, or lifting.  No driving for 2 weeks.   °Aquacel dressing may remain in place until follow up. May shower with aquacel dressing in place. If the dressing peels off or becomes saturated, you may remove aquacel dressing and place gauze and tape dressing which should be kept clean and dry and changed daily. Do not remove steri-strips if they are present. °See Dr. Gurnoor Sloop in office in 10 to 14 days. Begin taking aspirin 81mg per day starting 4 days after your surgery if not allergic to aspirin or on another blood thinner. °Walk daily even outside. Use a cane or walker only if necessary. °Avoid sitting on soft sofas. ° °

## 2016-10-31 NOTE — H&P (Signed)
[name] [name] is an 66 y.o. female.   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  Past Medical History:  Diagnosis Date  . Abdominal pain   . Arthritis   . Heart murmur   . Hypertension   . Left leg DVT (HCC) 06/2008   s/p knee surgery    Past Surgical History:  Procedure Laterality Date  . CHOLECYSTECTOMY  12/24/10  . KNEE SURGERY  2010   left mcl     History reviewed. No pertinent family history. Social History:  reports that she has never smoked. She has never used smokeless tobacco. She reports that she does not drink alcohol or use drugs.  Allergies: No Known Allergies  Medications Prior to Admission  Medication Sig Dispense Refill  . diltiazem (CARDIZEM CD) 240 MG 24 hr capsule Take 240 mg by mouth daily.      Marland Kitchen gabapentin (NEURONTIN) 300 MG capsule Take 300 mg by mouth See admin instructions. Take 1 capsule at bedtime for 3 days, then 1 capsule twice daily for 3 days, then 1 capsule three times daily    . hydrochlorothiazide (HYDRODIURIL) 25 MG tablet Take 25 mg by mouth daily.    . metoprolol succinate (TOPROL-XL) 100 MG 24 hr tablet Take 150 mg by mouth daily. Take with or immediately following a meal.    . olmesartan (BENICAR) 20 MG tablet Take 20 mg by mouth daily.    . potassium chloride (K-DUR,KLOR-CON) 10 MEQ tablet Take 10 mEq by mouth 2 (two) times daily.    . diclofenac (VOLTAREN) 75 MG EC tablet Take 1 tablet (75 mg total) by mouth 2 (two) times daily. (Patient not taking: Reported on 10/28/2016) 30 tablet 1  . furosemide (LASIX) 20 MG tablet Take 20 mg by mouth daily as needed for fluid.      Results for orders placed or performed during the hospital encounter of 10/29/16 (from the past 48 hour(s))  CBC     Status: None   Collection Time: 10/29/16  1:28 PM  Result Value Ref Range   WBC 8.6 4.0 - 10.5 K/uL   RBC 4.33 3.87 - 5.11 MIL/uL   Hemoglobin 12.7 12.0 - 15.0 g/dL   HCT 28.6 38.1 - 77.1 %   MCV 89.1  78.0 - 100.0 fL   MCH 29.3 26.0 - 34.0 pg   MCHC 32.9 30.0 - 36.0 g/dL   RDW 16.5 79.0 - 38.3 %   Platelets 287 150 - 400 K/uL  Surgical pcr screen     Status: None   Collection Time: 10/29/16  1:30 PM  Result Value Ref Range   MRSA, PCR NEGATIVE NEGATIVE   Staphylococcus aureus NEGATIVE NEGATIVE    Comment:        The Xpert SA Assay (FDA approved for NASAL specimens in patients over 58 years of age), is one component of a comprehensive surveillance program.  Test performance has been validated by [facility] for patients greater than or equal to 44 year old. It is not intended to diagnose infection nor to guide or monitor treatment.    Dg Lumbar Spine 2-3 Views  Result Date: 10/29/2016 CLINICAL DATA:  Preoperative examination prior to back surgery. EXAM: LUMBAR SPINE - 2-3 VIEW COMPARISON:  Chest x-ray of December 20, 2010 for purposes of vertebral level labeling. FINDINGS: There are 5 non rib-bearing lumbar type vertebral bodies. The twelfth ribs are hypoplastic. The vertebral bodies are preserved in height. There is  mild disc space narrowing at L4-5. There is no spondylolisthesis. There is mild facet joint hypertrophy at L4-5 and L5-S1. The pedicles and transverse processes are intact. IMPRESSION: There is mild degenerative disc space narrowing at L4-5. No compression fracture or spondylolisthesis. Electronically Signed   By: David  [place] M.D.   On: 10/29/2016 16:01    Review of Systems  Cardiovascular: Positive for claudication.  Musculoskeletal: Positive for back pain.  Neurological: Positive for tingling and focal weakness.    Blood pressure (!) 183/92, pulse 81, temperature 98.3 F (36.8 C), temperature source Oral, resp. rate 18, SpO2 98 %. Physical Exam  Constitutional: She is oriented to person, place, and time. She appears well-developed.  HENT:  Head: Normocephalic.  Eyes: Pupils are equal, round, and reactive to light.  Neck: Normal range of motion.   Cardiovascular: Normal rate.   Respiratory: Effort normal.  GI: Soft.  Musculoskeletal:  SLR + left . EHL 4/5 left. No DVT 2+ pulses.  Neurological: She is alert and oriented to person, place, and time.  Skin: Skin is warm and dry.  Psychiatric: She has a normal mood and affect.  MRI HNP, Stenosis L45 left.  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 Plan Decompression L45 left. Risks discussed.  Javier Docker, MD 10/31/2016, 12:55 PM

## 2016-10-31 NOTE — Brief Op Note (Signed)
10/31/2016  3:03 PM  PATIENT:  [name]  66 y.o. female  PRE-OPERATIVE DIAGNOSIS:  HNP, Stenosis L4-5  POST-OPERATIVE DIAGNOSIS:  HNP, Stenosis L4-5  PROCEDURE:  Procedure(s) with comments: Microlumbar decompression L4-5 (N/A) - 90 mins  SURGEON:  Surgeon(s) and Role:    Jene Every, MD - Primary  PHYSICIAN ASSISTANT:   ASSISTANTS: [place]   ANESTHESIA:   general  EBL:  Total I/O In: 1000 [I.V.:1000] Out: -   BLOOD ADMINISTERED:none  DRAINS: none   LOCAL MEDICATIONS USED:  MARCAINE     SPECIMEN:  Source of Specimen:  L45  DISPOSITION OF SPECIMEN:  PATHOLOGY  COUNTS:  YES  TOURNIQUET:  * No tourniquets in log *  DICTATION: .[id number]  PLAN OF CARE: Admit for overnight observation  PATIENT DISPOSITION:  PACU - hemodynamically stable.   Delay start of Pharmacological VTE agent (>24hrs) due to surgical blood loss or risk of bleeding: yes

## 2016-10-31 NOTE — Anesthesia Preprocedure Evaluation (Addendum)
Anesthesia Evaluation  Patient identified by MRN, date of birth, ID band Patient awake    Reviewed: Allergy & Precautions, NPO status , Patient's Chart, lab work & pertinent test results  Airway Mallampati: III  TM Distance: >3 FB Neck ROM: Full    Dental no notable dental hx. (+) Edentulous Upper, Edentulous Lower, Upper Dentures, Lower Dentures   Pulmonary neg pulmonary ROS,    Pulmonary exam normal breath sounds clear to auscultation       Cardiovascular hypertension, negative cardio ROS Normal cardiovascular exam+ Valvular Problems/Murmurs  Rhythm:Regular Rate:Normal + Systolic murmurs    Neuro/Psych negative neurological ROS  negative psych ROS   GI/Hepatic negative GI ROS, Neg liver ROS,   Endo/Other  negative endocrine ROS  Renal/GU negative Renal ROS  negative genitourinary   Musculoskeletal negative musculoskeletal ROS (+) Arthritis , Osteoarthritis,    Abdominal   Peds negative pediatric ROS (+)  Hematology negative hematology ROS (+)   Anesthesia Other Findings   Reproductive/Obstetrics negative OB ROS                            Anesthesia Physical Anesthesia Plan  ASA: II  Anesthesia Plan: General   Post-op Pain Management:    Induction: Intravenous  PONV Risk Score and Plan: 2 and Ondansetron, Dexamethasone, Treatment may vary due to age or medical condition and Midazolam  Airway Management Planned: Oral ETT  Additional Equipment:   Intra-op Plan:   Post-operative Plan: Extubation in OR  Informed Consent:   Plan Discussed with:   Anesthesia Plan Comments: (  )        Anesthesia Quick Evaluation

## 2016-10-31 NOTE — Transfer of Care (Signed)
Immediate Anesthesia Transfer of Care Note  Patient: [name] [name]  Procedure(s) Performed: Procedure(s) with comments: Microlumbar decompression L4-5 left  (Left) - 90 mins  Patient Location: PACU  Anesthesia Type:General  Level of Consciousness: Patient easily awoken, sedated, comfortable, cooperative, following commands, responds to stimulation.   Airway & Oxygen Therapy: Patient spontaneously breathing, ventilating well, oxygen via simple oxygen mask.  Post-op Assessment: Report given to PACU RN, vital signs reviewed and stable, moving all extremities.   Post vital signs: Reviewed and stable.  Complications: No apparent anesthesia complications Last Vitals:  Vitals:   10/31/16 1111 10/31/16 1525  BP: (!) 183/92 (!) 161/83  Pulse: 81 74  Resp: 18 (!) (P) 21  Temp: 36.8 C (P) 36.9 C  SpO2: 98% 100%    Last Pain:  Vitals:   10/31/16 1525  TempSrc:   PainSc: (P) 5       Patients Stated Pain Goal: 3 (10/31/16 1111)  Complications: No apparent anesthesia complications

## 2016-10-31 NOTE — Anesthesia Postprocedure Evaluation (Signed)
Anesthesia Post Note  Patient: [name] [name]  Procedure(s) Performed: Procedure(s) (LRB): Microlumbar decompression L4-5 left  (Left)     Patient location during evaluation: PACU Anesthesia Type: General Level of consciousness: awake Pain management: pain level controlled Vital Signs Assessment: post-procedure vital signs reviewed and stable Respiratory status: spontaneous breathing Cardiovascular status: stable Postop Assessment: no signs of nausea or vomiting Anesthetic complications: no    Last Vitals:  Vitals:   10/31/16 1600 10/31/16 1615  BP: 110/81   Pulse: 72 70  Resp: 15 16  Temp:    SpO2: 100% 100%    Last Pain:  Vitals:   10/31/16 1615  TempSrc:   PainSc: 3    Pain Goal: Patients Stated Pain Goal: 3 (10/31/16 1111)               Demareon Coldwell JR,JOHN Susann Givens

## 2016-10-31 NOTE — Anesthesia Procedure Notes (Signed)
Procedure Name: Intubation Date/Time: 10/31/2016 1:27 PM Performed by: Deliah Boston Pre-anesthesia Checklist: Patient identified, Emergency Drugs available, Suction available and Patient being monitored Patient Re-evaluated:Patient Re-evaluated prior to induction Oxygen Delivery Method: Circle system utilized Preoxygenation: Pre-oxygenation with 100% oxygen Induction Type: IV induction Ventilation: Mask ventilation without difficulty Laryngoscope Size: Mac and 3 Grade View: Grade I Tube type: Oral Tube size: 7.0 mm Number of attempts: 1 Airway Equipment and Method: Stylet and Oral airway Placement Confirmation: ETT inserted through vocal cords under direct vision,  positive ETCO2 and breath sounds checked- equal and bilateral Secured at: 21 cm Tube secured with: Tape Dental Injury: Teeth and Oropharynx as per pre-operative assessment

## 2016-11-01 DIAGNOSIS — M5116 Intervertebral disc disorders with radiculopathy, lumbar region: Secondary | ICD-10-CM | POA: Diagnosis not present

## 2016-11-01 LAB — BASIC METABOLIC PANEL
Anion gap: 7 (ref 5–15)
BUN: 15 mg/dL (ref 6–20)
CALCIUM: 8.7 mg/dL — AB (ref 8.9–10.3)
CO2: 29 mmol/L (ref 22–32)
CREATININE: 0.51 mg/dL (ref 0.44–1.00)
Chloride: 103 mmol/L (ref 101–111)
GFR calc non Af Amer: >60 mL/min (ref >60)
Glucose, Bld: 199 mg/dL — ABNORMAL HIGH (ref 65–99)
Potassium: 3.5 mmol/L (ref 3.5–5.1)
Sodium: 139 mmol/L (ref 135–145)

## 2016-11-01 NOTE — Care Management Note (Signed)
Case Management Note  Patient Details  Name: [name] [name] MRN: [id number] Date of Birth: 22-Mar-1950  Subjective/Objective:                 Spoke w patient on the phone. She states that she need 3/1 as ordered. Referral made to [facility], they will deliver to room prior to DC.    Action/Plan:  Will DC to home w 3/1. For any additional needs please call Lawerance Sabal RN CM [phone]. Expected Discharge Date:  11/01/16               Expected Discharge Plan:  Home/Self Care  In-House Referral:     Discharge planning Services  CM Consult  Post Acute Care Choice:    Choice offered to:     DME Arranged:  3-N-1 DME Agency:  [facility]  HH Arranged:    [facility] Agency:     Status of Service:  Completed, signed off  If discussed at Long Length of Stay Meetings, dates discussed:    Additional Comments:  Lawerance Sabal, RN 11/01/2016, 9:03 AM

## 2016-11-01 NOTE — Progress Notes (Signed)
   Subjective: 1 Day Post-Op Procedure(s) (LRB): Microlumbar decompression L4-5 left  (Left)  Pt feeling better today Left leg pain has improved  Would like to go home  Patient reports pain as mild.  Objective:   VITALS:   Vitals:   11/01/16 0124 11/01/16 0527  BP: (!) 174/76 (!) 174/79  Pulse: 62 67  Resp: 15 16  Temp: (!) 97.4 F (36.3 C) 98.1 F (36.7 C)  SpO2: 100% 100%    Lumbar dressing in place nv intact distally bilaterally No rashes or edema  LABS  Recent Labs  10/29/16 1328  HGB 12.7  HCT 38.6  WBC 8.6  PLT 287     Recent Labs  11/01/16 0512  NA 139  K 3.5  BUN 15  CREATININE 0.51  GLUCOSE 199*     Assessment/Plan: 1 Day Post-Op Procedure(s) (LRB): Microlumbar decompression L4-5 left  (Left) Pt doing well Recommend f/u in the office in 2 weeks Pain control as needed     Alphonsa Overall, MPAS, PA-C  11/01/2016, 7:54 AM

## 2016-11-01 NOTE — Progress Notes (Signed)
RN reviewed discharge instructions with patient and family. All questions answered.   Paperwork and prescriptions given. Patient awaiting 3in1 from [facility].  NT rolled patient down with all belongings to family car.

## 2016-11-01 NOTE — Evaluation (Signed)
Occupational Therapy Evaluation Patient Details Name: [name] [name] MRN: [id number] DOB: 1950/11/09 Today's Date: 11/01/2016    History of Present Illness s/p L4-5 decompression   Clinical Impression   This 66 year old female was admitted for the above sx. All education was completed. No further OT is needed at this time    Follow Up Recommendations  No OT follow up;Supervision/Assistance - 24 hour    Equipment Recommendations  3 in 1 bedside commode    Recommendations for Other Services       Precautions / Restrictions Precautions Precautions: Back Restrictions Weight Bearing Restrictions: No      Mobility Bed Mobility               General bed mobility comments: supervision sit<>sidelying  Transfers   Equipment used: None             General transfer comment: supervision, cues for back precautions    Balance                                           ADL either performed or assessed with clinical judgement   ADL Overall ADL's : Needs assistance/impaired     Grooming: Oral care;Supervision/safety;Standing   Upper Body Bathing: Set up;Sitting   Lower Body Bathing: Minimal assistance;Sit to/from stand   Upper Body Dressing : Set up;Sitting   Lower Body Dressing: Minimal assistance;Sit to/from stand   Toilet Transfer: Supervision/safety;Ambulation;BSC;RW   Toileting- Clothing Manipulation and Hygiene: Supervision/safety;Sit to/from stand         General ADL Comments: educated on back precautions and adls. Handout given. Pt needs to talk herself through activities as she moves quickly and is used to being independent.  She can cross RLE easily but L is more difficult. Gave her alternative ideas and worked with Sports administrator as she has one at [employer]      Pertinent Vitals/Pain Pain Assessment: Faces Faces Pain Scale: Hurts a little bit Pain Location: back Pain Descriptors /  Indicators: Sore Pain Intervention(s): Limited activity within patient's tolerance;Premedicated before session;Monitored during session;Repositioned     Hand Dominance     Extremity/Trunk Assessment Upper Extremity Assessment Upper Extremity Assessment: Overall WFL for tasks assessed           Communication Communication Communication: No difficulties   Cognition Arousal/Alertness: Awake/alert Behavior During Therapy: WFL for tasks assessed/performed Overall Cognitive Status: Within Functional Limits for tasks assessed                                 General Comments: pt is very independent; cues for back precautions   General Comments       Exercises     Shoulder Instructions      Home Living Family/patient expects to be discharged to:: Private residence Living Arrangements: Spouse/significant other Available Help at Discharge: Family               Bathroom Shower/Tub: Walk-in Human resources officer: Standard     Home Equipment: None          Prior Functioning/Environment Level of Independence: Independent                 OT Problem List:  OT Treatment/Interventions:      OT Goals(Current goals can be found in the care plan section) Acute Rehab OT Goals Patient Stated Goal: heal well and return to independence OT Goal Formulation: All assessment and education complete, DC therapy  OT Frequency:     Barriers to D/C:            Co-evaluation              AM-PAC PT "6 Clicks" Daily Activity     Outcome Measure Help from another person eating meals?: None Help from another person taking care of personal grooming?: A Little Help from another person toileting, which includes using toliet, bedpan, or urinal?: A Little Help from another person bathing (including washing, rinsing, drying)?: A Little Help from another person to put on and taking off regular upper body clothing?: A Little Help from another person  to put on and taking off regular lower body clothing?: A Little 6 Click Score: 19   End of Session    Activity Tolerance: Patient tolerated treatment well Patient left: in chair;with call bell/phone within reach  OT Visit Diagnosis: Muscle weakness (generalized) (M62.81)                Time: 2330-0762 OT Time Calculation (min): 37 min Charges:  OT General Charges $OT Visit: 1 Procedure OT Evaluation $OT Eval Low Complexity: 1 Procedure OT Treatments $Self Care/Home Management : 8-22 mins G-Codes: OT G-codes **NOT FOR INPATIENT CLASS** Functional Assessment Tool Used: Clinical judgement Functional Limitation: Self care Self Care Current Status (U6333): At least 20 percent but less than 40 percent impaired, limited or restricted Self Care Goal Status (L4562): At least 20 percent but less than 40 percent impaired, limited or restricted Self Care Discharge Status [phone]): At least 20 percent but less than 40 percent impaired, limited or restricted   Marica Otter, OTR/L [phone] 11/01/2016  [name] [name] 11/01/2016, 9:19 AM

## 2016-11-01 NOTE — Evaluation (Signed)
Physical Therapy Evaluation Patient Details Name: [name] [name] MRN: [id number] DOB: [birth year] Today's Date: 11/01/2016   History of Present Illness  s/p L4-5 decompression  Clinical Impression  Patient evaluated by Physical Therapy with no further acute PT needs identified. All education has been completed and the patient has no further questions. *See below for any follow-up Physical Therapy or equipment needs. PT is signing off. Thank you for this referral.     Follow Up Recommendations DC plan and follow up therapy as arranged by surgeon    Equipment Recommendations  None recommended by PT    Recommendations for Other Services       Precautions / Restrictions Precautions Precautions: Back Restrictions Weight Bearing Restrictions: No Other Position/Activity Restrictions: WBAT      Mobility  Bed Mobility               General bed mobility comments: OOB in chair--pt verbalized log rolling technique  Transfers Overall transfer level: Needs assistance Equipment used: Rolling walker (2 wheeled) Transfers: Sit to/from Stand           General transfer comment: supervision, cues for back precautions  Ambulation/Gait Ambulation/Gait assistance: Min guard;Supervision Ambulation Distance (Feet): 400 Feet Assistive device: None Gait Pattern/deviations: Trunk flexed;Step-through pattern;Decreased stride length;Drifts right/left     General Gait Details: pt unsteady at times with decr toe clearance on L intermittently; LOB x 1 with independent recovery; fairly rapid gait velocity, cautioned pt to slow down;   Stairs Stairs: Yes Stairs assistance: Min guard Stair Management: Step to pattern;One rail Right Number of Stairs: 4 General stair comments: cues for sequence and min/guard for safety  Wheelchair Mobility    Modified Rankin (Stroke Patients Only)       Balance Overall balance assessment: Needs assistance (denies hx of falls)   Sitting  balance-Leahy Scale: Good       Standing balance-Leahy Scale: Good               High level balance activites: Head turns;Direction changes;Turns High Level Balance Comments: LOB x1, IND recovery although min/guard for safety             Pertinent Vitals/Pain Pain Assessment: No/denies pain Faces Pain Scale: Hurts a little bit Pain Location: back Pain Descriptors / Indicators: Sore Pain Intervention(s): Limited activity within patient's tolerance;Premedicated before session;Monitored during session;Repositioned    Home Living Family/patient expects to be discharged to:: Private residence Living Arrangements: Spouse/significant other Available Help at Discharge: Family Type of Home: House Home Access: Stairs to enter   Secretary/administrator of Steps: 2-3   Home Equipment: None      Prior Function Level of Independence: Independent               Hand Dominance        Extremity/Trunk Assessment   Upper Extremity Assessment Upper Extremity Assessment: Overall WFL for tasks assessed;Defer to OT evaluation    Lower Extremity Assessment Lower Extremity Assessment: Overall WFL for tasks assessed;LLE deficits/detail LLE Deficits / Details: decr coordination/timing of df noted with gait        Communication   Communication: No difficulties  Cognition Arousal/Alertness: Awake/alert Behavior During Therapy: WFL for tasks assessed/performed Overall Cognitive Status: Within Functional Limits for tasks assessed                                 General Comments: pt is very independent; cues for back precautions  General Comments      Exercises     Assessment/Plan    PT Assessment Patent does not need any further PT services  PT Problem List         PT Treatment Interventions      PT Goals (Current goals can be found in the Care Plan section)  Acute Rehab PT Goals Patient Stated Goal: heal well and return to independence PT  Goal Formulation: All assessment and education complete, DC therapy    Frequency     Barriers to discharge        Co-evaluation               AM-PAC PT "6 Clicks" Daily Activity  Outcome Measure Difficulty turning over in bed (including adjusting bedclothes, sheets and blankets)?: A Little Difficulty moving from lying on back to sitting on the side of the bed? : A Little Difficulty sitting down on and standing up from a chair with arms (e.g., wheelchair, bedside commode, etc,.)?: A Little Help needed moving to and from a bed to chair (including a wheelchair)?: A Little Help needed walking in hospital room?: A Little Help needed climbing 3-5 steps with a railing? : A Little 6 Click Score: 18    End of Session   Activity Tolerance: Patient tolerated treatment well Patient left: with call bell/phone within reach;in chair;with family/visitor present   PT Visit Diagnosis: Unsteadiness on feet (R26.81)    Time: 1015-1030 PT Time Calculation (min) (ACUTE ONLY): 15 min   Charges:   PT Evaluation $PT Eval Low Complexity: 1 Low     PT G Codes:   PT G-Codes **NOT FOR INPATIENT CLASS** Functional Assessment Tool Used: AM-PAC 6 Clicks Basic Mobility;Clinical judgement Functional Limitation: Mobility: Walking and moving around Mobility: Walking and Moving Around Current Status (B3419): At least 1 percent but less than 20 percent impaired, limited or restricted Mobility: Walking and Moving Around Goal Status [phone]): At least 1 percent but less than 20 percent impaired, limited or restricted Mobility: Walking and Moving Around Discharge Status [phone]): At least 1 percent but less than 20 percent impaired, limited or restricted      [facility] 11/01/2016, 11:06 AM

## 2016-11-01 NOTE — Discharge Summary (Signed)
Physician Discharge Summary   Patient ID: [name] [name] MRN: [id number] DOB/AGE: [birth year] 66 y.o.  Admit date: 10/31/2016 Discharge date: 11/01/2016  Admission Diagnoses:  Active Problems:   HNP (herniated nucleus pulposus), lumbar   Discharge Diagnoses:  Same   Surgeries: Procedure(s): Microlumbar decompression L4-5 left  on 10/31/2016   Consultants: PT/OT  Discharged Condition: Stable  Hospital Course: [name] [name] is an 66 y.o. female who was admitted 10/31/2016 with a chief complaint of lumbar radiculopathy, and found to have a diagnosis of lumbar HNP.  They were brought to the operating room on 10/31/2016 and underwent the above named procedures.    The patient had an uncomplicated hospital course and was stable for discharge.  Recent vital signs:  Vitals:   11/01/16 0124 11/01/16 0527  BP: (!) 174/76 (!) 174/79  Pulse: 62 67  Resp: 15 16  Temp: (!) 97.4 F (36.3 C) 98.1 F (36.7 C)  SpO2: 100% 100%    Recent laboratory studies:  Results for orders placed or performed during the hospital encounter of 10/31/16  Basic Metabolic Panel  Result Value Ref Range   Sodium 139 135 - 145 mmol/L   Potassium 3.5 3.5 - 5.1 mmol/L   Chloride 103 101 - 111 mmol/L   CO2 29 22 - 32 mmol/L   Glucose, Bld 199 (H) 65 - 99 mg/dL   BUN 15 6 - 20 mg/dL   Creatinine, Ser 7.82 0.44 - 1.00 mg/dL   Calcium 8.7 (L) 8.9 - 10.3 mg/dL   GFR calc non Af Amer >60 >60 mL/min   GFR calc Af Amer >60 >60 mL/min   Anion gap 7 5 - 15    Discharge Medications:   Allergies as of 11/01/2016   No Known Allergies     Medication List    TAKE these medications   diclofenac 75 MG EC tablet Commonly known as:  VOLTAREN Take 1 tablet (75 mg total) by mouth 2 (two) times daily. Resume 5 days post-op as needed What changed:  additional instructions   diltiazem 240 MG 24 hr capsule Commonly known as:  CARDIZEM CD Take 240 mg by mouth daily.   docusate sodium 100 MG capsule Commonly  known as:  COLACE Take 1 capsule (100 mg total) by mouth 2 (two) times daily as needed for mild constipation.   furosemide 20 MG tablet Commonly known as:  LASIX Take 20 mg by mouth daily as needed for fluid.   gabapentin 300 MG capsule Commonly known as:  NEURONTIN Take 300 mg by mouth See admin instructions. Take 1 capsule at bedtime for 3 days, then 1 capsule twice daily for 3 days, then 1 capsule three times daily   hydrochlorothiazide 25 MG tablet Commonly known as:  HYDRODIURIL Take 25 mg by mouth daily.   metoprolol succinate 100 MG 24 hr tablet Commonly known as:  TOPROL-XL Take 150 mg by mouth daily. Take with or immediately following a meal.   olmesartan 20 MG tablet Commonly known as:  BENICAR Take 20 mg by mouth daily.   oxyCODONE-acetaminophen 5-325 MG tablet Commonly known as:  PERCOCET Take 1-2 tablets by mouth every 4 (four) hours as needed for severe pain.   polyethylene glycol packet Commonly known as:  MIRALAX / GLYCOLAX Take 17 g by mouth daily.   potassium chloride 10 MEQ tablet Commonly known as:  K-DUR,KLOR-CON Take 10 mEq by mouth 2 (two) times daily.            Discharge Care  Instructions        Start     Ordered   11/01/16 0000  Call MD / Call 911    Comments:  If you experience chest pain or shortness of breath, CALL 911 and be transported to the hospital emergency room.  If you develope a fever above 101 F, pus (white drainage) or increased drainage or redness at the wound, or calf pain, call your surgeon's office.   11/01/16 0800   11/01/16 0000  Diet - low sodium heart healthy     11/01/16 0800   11/01/16 0000  Constipation Prevention    Comments:  Drink plenty of fluids.  Prune juice may be helpful.  You may use a stool softener, such as Colace (over the counter) 100 mg twice a day.  Use MiraLax (over the counter) for constipation as needed.   11/01/16 0800   11/01/16 0000  Increase activity slowly as tolerated     11/01/16 0800    10/31/16 0000  docusate sodium (COLACE) 100 MG capsule  2 times daily PRN     10/31/16 1513   10/31/16 0000  polyethylene glycol (MIRALAX / GLYCOLAX) packet  Daily     10/31/16 1513   10/31/16 0000  oxyCODONE-acetaminophen (PERCOCET) 5-325 MG tablet  Every 4 hours PRN     10/31/16 1513   10/31/16 0000  diclofenac (VOLTAREN) 75 MG EC tablet  2 times daily     10/31/16 1531      Diagnostic Studies: Dg Lumbar Spine 2-3 Views  Result Date: 10/29/2016 CLINICAL DATA:  Preoperative examination prior to back surgery. EXAM: LUMBAR SPINE - 2-3 VIEW COMPARISON:  Chest x-ray of December 20, 2010 for purposes of vertebral level labeling. FINDINGS: There are 5 non rib-bearing lumbar type vertebral bodies. The twelfth ribs are hypoplastic. The vertebral bodies are preserved in height. There is mild disc space narrowing at L4-5. There is no spondylolisthesis. There is mild facet joint hypertrophy at L4-5 and L5-S1. The pedicles and transverse processes are intact. IMPRESSION: There is mild degenerative disc space narrowing at L4-5. No compression fracture or spondylolisthesis. Electronically Signed   By: David  [place] M.D.   On: 10/29/2016 16:01   Dg Spine Portable 1 View  Result Date: 10/31/2016 CLINICAL DATA:  Lumbar spine surgery. EXAM: PORTABLE SPINE - 1 VIEW COMPARISON:  10/31/2016 FINDINGS: Metallic marker noted at the posterior L4-5 interspace. IMPRESSION: Metallic marker posteriorly at the L4-5 level. Electronically Signed   By: Sande Brothers M.D.   On: 10/31/2016 15:04   Dg Spine Portable 1 View  Result Date: 10/31/2016 CLINICAL DATA:  Lumbar spine surgery. EXAM: PORTABLE SPINE - 1 VIEW COMPARISON:  10/31/2016. FINDINGS: Lumbar spine numbered as per prior exam. Metallic marker noted posteriorly at the L4-L5 level. IMPRESSION: Metallic marker noted posteriorly at the L4-L5 level. Electronically Signed   By: Maisie Fus  Register   On: 10/31/2016 14:02   Dg Spine Portable 1 View  Result Date:  10/31/2016 CLINICAL DATA:  Intraoperative localization.  Surgical level L4-5. EXAM: PORTABLE SPINE - 1 VIEW COMPARISON:  10/29/2016 FINDINGS: Same numbering scheme used for this study as on the exam from 2 days ago. Radiopaque spinal needles overlie the soft tissues of the lower back. The more cranial of the 2 needles is positioned with its tip overlying a location just posterior to the L4 spinous process. The more caudal of the 2 needle tips is overlying a position just posterior to the L5 spinous process. IMPRESSION: Intraoperative localization. Electronically Signed  By: Kennith Center M.D.   On: 10/31/2016 13:54    Disposition: 01-Home or Self Care  Discharge Instructions    Call MD / Call 911    Complete by:  As directed    If you experience chest pain or shortness of breath, CALL 911 and be transported to the hospital emergency room.  If you develope a fever above 101 F, pus (white drainage) or increased drainage or redness at the wound, or calf pain, call your surgeon's office.   Constipation Prevention    Complete by:  As directed    Drink plenty of fluids.  Prune juice may be helpful.  You may use a stool softener, such as Colace (over the counter) 100 mg twice a day.  Use MiraLax (over the counter) for constipation as needed.   Diet - low sodium heart healthy    Complete by:  As directed    Increase activity slowly as tolerated    Complete by:  As directed       Follow-up Information    Jene Every, MD Follow up in 2 week(s).   Specialty:  Orthopedic Surgery Contact information: [address] [place] [address] [phone]            Signed: Thea Gist 11/01/2016, 8:00 AM

## 2016-11-02 NOTE — Op Note (Signed)
NAME:  [name] [name]                  ACCOUNT NO.:  MEDICAL RECORD NO.:  [id number]  LOCATION:                                 FACILITY:  PHYSICIAN:  Jene Every, M.D.         DATE OF BIRTH:  DATE OF PROCEDURE:  10/31/2016 DATE OF DISCHARGE:                              OPERATIVE REPORT   PREOPERATIVE DIAGNOSIS:  Spinal stenosis, herniated nucleus pulposus, L4- 5, left.  POSTOPERATIVE DIAGNOSIS:  Spinal stenosis, herniated nucleus pulposus, L4-5, left.  PROCEDURE PERFORMED: 1. Microlumbar decompression, L4-5, left. 2. Foraminotomies, L4-L5, left. 3. Microdiskectomy, L4-L5, left.  ANESTHESIA:  General.  ASSISTANT:  Lanna Poche, PA.  This specimen was sent to Pathology.  HISTORY:  A 65, L5 radiculopathy secondary to neural compression, disk herniation, scoliosis and lateral recess stenosis refractory to conservative treatment.  Risks and benefits discussed including bleeding, infection, damage to the neurovascular structures, no change in symptoms, worsening symptoms, DVT, PE, anesthetic complications, need for fusion in the future, etc.  TECHNIQUE:  With the patient in supine position, after the induction of adequate general anesthesia and 2 g of Kefzol, placed prone on the [place]worth frame.  All bony prominences were well padded.  Lumbar region was prepped and draped in usual sterile fashion.  Two 18-gauge spinal needles were utilized to localize 4-5 interspace, confirmed with x-ray. Incision was made from spinous process at 4-5.  Subcutaneous tissue was dissected, electrocautery was utilized to achieve hemostasis. Dorsolumbar fascia was divided in line with skin incision.  Paraspinous muscle elevated from lamina of 4-5.  Operating microscope was draped and brought on the surgical field.  Confirmatory radiograph obtained. Hemilaminotomy at the caudad edge of 4 was performed with a 2 and 3-mm Kerrison and utilized a straight curette to detach the ligamentum  flavum from the cephalad edge of 5.  Hypertrophic ligamentum flavum was removed from the interspace.  I performed a foraminotomy of 5, identified the 5 root, gently mobilized it medially.  After performing a foraminotomy of 5, there was a disk herniation noted and proximally over to the shoulder of the root, it was compressed into compressing the lateral recess and protected the nerve root meticulously, decompressed the lateral recess to the medial border of the pedicle, came to the shoulder of the root with an epidural venous plexus.  We gently mobilized the shoulder of the root medially.  The nerve root was flattened over the disk herniation. We gently mobilized it medially and laterally and made a small incision in the disk annulus after identified with an 18-gauge needle. Immediately, extruded fragments from the disk herniation mobilizing it with a nerve hook and a micropituitary.  Once that performed, provisional decompression, I was able to mobilize the 5 root more medially.  I then proceeded to perform a full diskectomy with multiple passes with a pituitary, further irrigation with antibiotic irrigation and retrieval of additional fragments, further mobilizing it with an Epstein and removal of fragments.  Following this, there was at least 1 cm of excursion of the 5 root medial to pedicle without tension.  No evidence of CSF leakage or active bleeding.  I had performed a foraminotomy of  4 as well, preserving the pars, gained access to the shoulder of the root.  With the scoliosis, there were some changes in the topography of the exiting nerve root.  I felt there was excellent decompression, no evidence of CSF leakage or active bleeding. Confirmatory radiograph obtained.  Irrigated it with antibiotic irrigation and small piece of thrombin-soaked Gelfoam was then placed in laminotomy defect and removed.  The disk material was sent to Pathology. I checked in the axilla of the root,  the shoulder of the root, the foramen of 4 and 5 beneath the thecal sac, no residual disk herniation.  I then irrigated the paraspinous musculature, closed the dorsolumbar fascia with 1 Vicryl, subcu with 2 and skin with Prolene.  Sterile dressing applied.  Placed supine on the hospital bed, extubated without difficulty and transported to the recovery room in satisfactory condition.  The patient tolerated the procedure well.  No complications.  Assistant, Lanna Poche, [place].  Minimal blood loss.     Jene Every, M.D.     Cordelia Pen  D:  10/31/2016  T:  11/01/2016  Job:  [id number]

## 2017-03-05 ENCOUNTER — Other Ambulatory Visit: Payer: Self-pay

## 2017-03-05 ENCOUNTER — Encounter (HOSPITAL_COMMUNITY): Payer: Self-pay | Admitting: *Deleted

## 2017-03-05 ENCOUNTER — Emergency Department (HOSPITAL_COMMUNITY)
Admission: EM | Admit: 2017-03-05 | Discharge: 2017-03-05 | Disposition: A | Discharge status: Home / Self Care | Payer: 59 | Attending: Emergency Medicine | Admitting: Emergency Medicine

## 2017-03-05 DIAGNOSIS — M5442 Lumbago with sciatica, left side: Secondary | ICD-10-CM | POA: Diagnosis not present

## 2017-03-05 DIAGNOSIS — Z86718 Personal history of other venous thrombosis and embolism: Secondary | ICD-10-CM | POA: Insufficient documentation

## 2017-03-05 DIAGNOSIS — Z79899 Other long term (current) drug therapy: Secondary | ICD-10-CM | POA: Diagnosis not present

## 2017-03-05 DIAGNOSIS — I10 Essential (primary) hypertension: Secondary | ICD-10-CM | POA: Diagnosis not present

## 2017-03-05 DIAGNOSIS — M545 Low back pain: Secondary | ICD-10-CM | POA: Diagnosis present

## 2017-03-05 MED ORDER — LIDOCAINE 5 % EX PTCH: 1.0000 | MEDICATED_PATCH | CUTANEOUS | 0 refills | Status: DC

## 2017-03-05 MED ORDER — PREDNISONE 10 MG (21) PO TBPK: ORAL_TABLET | Freq: Every day | ORAL | 0 refills | Status: DC

## 2017-03-05 MED ORDER — OXYCODONE-ACETAMINOPHEN 5-325 MG PO TABS: 1.0000 | ORAL_TABLET | ORAL | 0 refills | Status: DC | PRN

## 2017-03-05 NOTE — Discharge Instructions (Signed)
Take prednisone as prescribed. Use Lidoderm patches as needed for pain. Take percocet as needed for severe pain. If you need more pain medication, this will have to be provided by your doctor.  Continue taking all your at home medications as prescribed.  Follow up with your spine doctor for further management and evaluation of your pain.  Return to the ER if you develop fevers, chills, loss of bowel or bladder control, numbness, or any new or concerning symptoms.

## 2017-03-05 NOTE — ED Triage Notes (Signed)
Pt states has been told by MD that she has a ruptured disc and has given her multiple cortisone shots.  Yesterday she did more walking than she normally does, and today when she was plugging in her cell phone the pain to her L hip joint became intense.

## 2017-03-05 NOTE — ED Provider Notes (Signed)
[facility]ENC[facility] Provider Note   CSN: [id number] Arrival date & time: 03/05/17  [id number]     History   Chief Complaint Chief Complaint  Patient presents with  . Hip Pain    HPI [name] [name] is a 66 y.o. female presenting for evaluation of back pain.   Pt states she has a h/o back pain, and had surgery on 10/31/16. She was doing ok when today the pain became worse. She went to plug in her phone and had acute onset L sided back pain with shooting pain that radiates down the L leg. This was mildy improved with percocet. No improvement with gabapentin. She denies fall, truama, or injury. She has an appointment with her orthopedic doctor for a shot on 03/17/17. She does not have an appointment with her back surgeron. She denies red flag symptoms including fever, chills, rash, numbness, tingling, loss of bowel or bladder control, or fall.  Patient states the pain is severe, described as a severe, sharp, shooting pain.    HPI  Past Medical History:  Diagnosis Date  . Abdominal pain   . Arthritis   . Heart murmur   . Hypertension   . Left leg DVT (HCC) 06/2008   s/p knee surgery    Patient Active Problem List   Diagnosis Date Noted  . HNP (herniated nucleus pulposus), lumbar 10/31/2016  . URI (upper respiratory infection) 07/24/2011    Past Surgical History:  Procedure Laterality Date  . CHOLECYSTECTOMY  12/24/10  . KNEE SURGERY  2010   left mcl   . LUMBAR LAMINECTOMY/DECOMPRESSION MICRODISCECTOMY Left 10/31/2016   Procedure: Microlumbar decompression L4-5 left ;  Surgeon: Jene EveryBeane, Jeffrey, MD;  Location: [facility];  Service: Orthopedics;  Laterality: Left;  90 mins    OB History    No data available       Home Medications    Prior to Admission medications   Medication Sig Start Date End Date Taking? Authorizing Provider  diclofenac (VOLTAREN) 75 MG EC tablet Take 1 tablet (75 mg total) by mouth 2 (two) times daily. Resume 5 days post-op  as needed 10/31/16   Dorothy SparkBissell, Jaclyn M, PA-C  diltiazem (CARDIZEM CD) 240 MG 24 hr capsule Take 240 mg by mouth daily.      [provider]  docusate sodium (COLACE) 100 MG capsule Take 1 capsule (100 mg total) by mouth 2 (two) times daily as needed for mild constipation. 10/31/16   Jene EveryBeane, Jeffrey, MD  furosemide (LASIX) 20 MG tablet Take 20 mg by mouth daily as needed for fluid.    [provider]  gabapentin (NEURONTIN) 300 MG capsule Take 300 mg by mouth See admin instructions. Take 1 capsule at bedtime for 3 days, then 1 capsule twice daily for 3 days, then 1 capsule three times daily    [provider]  hydrochlorothiazide (HYDRODIURIL) 25 MG tablet Take 25 mg by mouth daily.    [provider]  lidocaine (LIDODERM) 5 % Place 1 patch onto the skin daily. Remove & Discard patch within 12 hours or as directed by MD 03/05/17   Ellon Marasco, PA-C  metoprolol succinate (TOPROL-XL) 100 MG 24 hr tablet Take 150 mg by mouth daily. Take with or immediately following a meal.    [provider]  olmesartan (BENICAR) 20 MG tablet Take 20 mg by mouth daily.    [provider]  oxyCODONE-acetaminophen (PERCOCET) 5-325 MG tablet Take 1-2 tablets by mouth every 4 (four) hours  as needed for severe pain. 10/31/16   Jene Every, MD  oxyCODONE-acetaminophen (PERCOCET/ROXICET) 5-325 MG tablet Take 1-2 tablets by mouth every 4 (four) hours as needed for severe pain. 03/05/17   Regis Wiland, PA-C  polyethylene glycol (MIRALAX / GLYCOLAX) packet Take 17 g by mouth daily. 10/31/16   Jene Every, MD  potassium chloride (K-DUR,KLOR-CON) 10 MEQ tablet Take 10 mEq by mouth 2 (two) times daily.    [provider]  predniSONE (STERAPRED UNI-PAK 21 TAB) 10 MG (21) TBPK tablet Take by mouth daily. Take 6 tabs by mouth daily  for 2 days, then 5 tabs for 2 days, then 4 tabs for 2 days, then 3 tabs for 2 days, 2 tabs for 2 days, then 1 tab by mouth daily for 2  days 03/05/17   Ocean Kearley, PA-C    Family History No family history on file.  Social History Social History   Tobacco Use  . Smoking status: Never Smoker  . Smokeless tobacco: Never Used  Substance Use Topics  . Alcohol use: No  . Drug use: No     Allergies   Patient has no known allergies.   Review of Systems Review of Systems  Musculoskeletal: Positive for back pain.  Neurological: Negative for numbness.     Physical Exam Updated Vital Signs BP (!) 158/66 (BP Location: Left Arm)   Pulse 86   Temp 98.2 F (36.8 C) (Oral)   Resp 20   Ht 5\' 6"  (1.676 m)   Wt 82.6 kg (182 lb)   SpO2 99%   BMI 29.38 kg/m   Physical Exam  Constitutional: She is oriented to person, place, and time. She appears well-developed and well-nourished. No distress.  HENT:  Head: Normocephalic and atraumatic.  Eyes: EOM are normal.  Neck: Normal range of motion.  Cardiovascular: Normal rate, regular rhythm and intact distal pulses.  Pulmonary/Chest: Effort normal and breath sounds normal. No respiratory distress. She has no wheezes.  Abdominal: She exhibits no distension.  Musculoskeletal: She exhibits tenderness.  Tenderness palpation of left lower back and hip.  No increased pain over midline spine.  Palpation causes shooting pain down the left leg.  Pedal pulses intact bilaterally.  Sensation intact bilaterally.  Soft compartments.  No tenderness palpation of the right side back  Neurological: She is alert and oriented to person, place, and time. No sensory deficit.  Skin: Skin is warm and dry.  Psychiatric: She has a normal mood and affect.  Nursing note and vitals reviewed.    ED Treatments / Results  Labs (all labs ordered are listed, but only abnormal results are displayed) Labs Reviewed - No data to display  EKG  EKG Interpretation None       Radiology No results found.  Procedures Procedures (including critical care time)  Medications Ordered in  ED Medications - No data to display   Initial Impression / Assessment and Plan / ED Course  I have reviewed the triage vital signs and the nursing notes.  Pertinent labs & imaging results that were available during my care of the patient were reviewed by me and considered in my medical decision making (see chart for details).     Patient presenting for evaluation of worsening back pain.  Physical exam shows patient is neurovascularly intact.  Pain is reproducible and causes shooting pain down the left leg.  Likely sciatica.  Patient states she has been diagnosed with this before, and prednisone has worked well for her in the  past.  Will treat with a prednisone pack and Lidoderm.  Will give short prescription of pain medication, checked [facility] and patient had a 15-day refill 1 month ago and has been refilling medications appropriately.  Patient to follow-up with her orthopedic and back doctors.  At this time, patient appears safe for discharge.  Return precautions given.  Patient states she understands and agrees to plan.  Final Clinical Impressions(s) / ED Diagnoses   Final diagnoses:  Acute left-sided low back pain with left-sided sciatica    ED Discharge Orders        Ordered    predniSONE (STERAPRED UNI-PAK 21 TAB) 10 MG (21) TBPK tablet  Daily     03/05/17 2051    lidocaine (LIDODERM) 5 %  Every 24 hours     03/05/17 2051    oxyCODONE-acetaminophen (PERCOCET/ROXICET) 5-325 MG tablet  Every 4 hours PRN     03/05/17 2051       Alveria ApleyCaccavale, Sydelle Sherfield, PA-C 03/06/17 0054    Jacalyn LefevreHaviland, Julie, MD 03/06/17 1511

## 2017-03-05 NOTE — ED Notes (Signed)
Pt in triage hallway

## 2017-03-17 DIAGNOSIS — M961 Postlaminectomy syndrome, not elsewhere classified: Secondary | ICD-10-CM | POA: Insufficient documentation

## 2017-03-31 DIAGNOSIS — M419 Scoliosis, unspecified: Secondary | ICD-10-CM | POA: Insufficient documentation

## 2017-04-02 NOTE — Progress Notes (Signed)
Please place orders in Epic as patient is being scheduled for a pre-op appointment! Thank you! 

## 2017-04-03 ENCOUNTER — Ambulatory Visit: Payer: Self-pay | Admitting: Orthopedic Surgery

## 2017-04-03 NOTE — H&P (View-Only) (Signed)
[name] DubsMarilyn P [name] is an 67 y.o. female.   Chief Complaint: back and left leg pain HPI: Patient reports leg pain on the left, lateral. She reports (normal) visit for: (lumbar spine). She reports pain level 8/10. The patient is status post left L5 SNRB x 2 weeks. The patient reports no relief from the injection. She is 5 months out from lumbar decompression, initially did well post-op, experienced reinnervation neuritis, symptoms refractory to steroid medications, steroid injections.   Past Medical History:  Diagnosis Date  . Abdominal pain   . Arthritis   . Heart murmur   . Hypertension   . Left leg DVT (HCC) 06/2008   s/p knee surgery    Past Surgical History:  Procedure Laterality Date  . CHOLECYSTECTOMY  12/24/10  . KNEE SURGERY  2010   left mcl   . LUMBAR LAMINECTOMY/DECOMPRESSION MICRODISCECTOMY Left 10/31/2016   Procedure: Microlumbar decompression L4-5 left ;  Surgeon: Jene EveryBeane, Jeffrey, MD;  Location: [facility];  Service: Orthopedics;  Laterality: Left;  90 mins    No family history on file. Social History:  reports that  has never smoked. she has never used smokeless tobacco. She reports that she does not drink alcohol or use drugs.  Allergies: No Known Allergies   (Not in a hospital admission)  No results found for this or any previous visit (from the past 48 hour(s)). No results found.  Review of Systems  Constitutional: Negative.   HENT: Negative.   Eyes: Negative.   Respiratory: Negative.   Cardiovascular: Negative.   Gastrointestinal: Negative.   Genitourinary: Negative.   Musculoskeletal: Positive for back pain.  Skin: Negative.   Neurological: Positive for sensory change and focal weakness.    There were no vitals taken for this visit. Physical Exam  Constitutional: She is oriented to person, place, and time. She appears well-developed and well-nourished.  HENT:  Head: Normocephalic.  Eyes: Pupils are equal, round, and reactive to light.  Neck: Normal  range of motion.  Cardiovascular: Normal rate.  Respiratory: Effort normal.  GI: Soft.  Musculoskeletal:  Straight leg raise left buttock thigh and calf pain. She last 15 of extension of the knee. EHLs 5-/5 left compared to the right. She is tender over the trochanter and in the sciatic notch. And lumbosacral junction.  Noticeability hips knees and ankles.  Her review Dr. Grant Fontanaamos's note L5 selective nerve root block technically successful but not therapeutic   Neurological: She is alert and oriented to person, place, and time.    MRI demonstrates recurrent disc herniation L4-5.  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 She did have some trochanteric bursitis. She had no help from the selective nerve root block.  We discussed options including living with her symptoms with pharmacologic management she does not feel she can tolerate within therefore discussed operative options which include a revision lumbar decompression versus a revision lumbar decompression and a fusion. She has been very little to no back pain. We discussed revision decompression. She did well after her initial surgery in August until she had an episode in December where she turned to pulling a cord and then had the acute onset of recurrent pain. Discussed utilizing a brace perhaps postoperatively. I would recommend keeping her out of work. We discussed the possibility again of recurrent disc herniation require require a fusion.  She does have underlying probable  osteopenia.  Plan revision microlumbar decompression L4-5  Dorothy Spark., PA-C for Dr. Shelle Iron 04/03/2017, 8:52 AM

## 2017-04-03 NOTE — H&P (Signed)
[name] DubsMarilyn P [name] is an 67 y.o. female.   Chief Complaint: back and left leg pain HPI: Patient reports leg pain on the left, lateral. She reports (normal) visit for: (lumbar spine). She reports pain level 8/10. The patient is status post left L5 SNRB x 2 weeks. The patient reports no relief from the injection. She is 5 months out from lumbar decompression, initially did well post-op, experienced reinnervation neuritis, symptoms refractory to steroid medications, steroid injections.   Past Medical History:  Diagnosis Date  . Abdominal pain   . Arthritis   . Heart murmur   . Hypertension   . Left leg DVT (HCC) 06/2008   s/p knee surgery    Past Surgical History:  Procedure Laterality Date  . CHOLECYSTECTOMY  12/24/10  . KNEE SURGERY  2010   left mcl   . LUMBAR LAMINECTOMY/DECOMPRESSION MICRODISCECTOMY Left 10/31/2016   Procedure: Microlumbar decompression L4-5 left ;  Surgeon: Jene EveryBeane, Jeffrey, MD;  Location: [facility];  Service: Orthopedics;  Laterality: Left;  90 mins    No family history on file. Social History:  reports that  has never smoked. she has never used smokeless tobacco. She reports that she does not drink alcohol or use drugs.  Allergies: No Known Allergies   (Not in a hospital admission)  No results found for this or any previous visit (from the past 48 hour(s)). No results found.  Review of Systems  Constitutional: Negative.   HENT: Negative.   Eyes: Negative.   Respiratory: Negative.   Cardiovascular: Negative.   Gastrointestinal: Negative.   Genitourinary: Negative.   Musculoskeletal: Positive for back pain.  Skin: Negative.   Neurological: Positive for sensory change and focal weakness.    There were no vitals taken for this visit. Physical Exam  Constitutional: She is oriented to person, place, and time. She appears well-developed and well-nourished.  HENT:  Head: Normocephalic.  Eyes: Pupils are equal, round, and reactive to light.  Neck: Normal  range of motion.  Cardiovascular: Normal rate.  Respiratory: Effort normal.  GI: Soft.  Musculoskeletal:  Straight leg raise left buttock thigh and calf pain. She last 15 of extension of the knee. EHLs 5-/5 left compared to the right. She is tender over the trochanter and in the sciatic notch. And lumbosacral junction.  Noticeability hips knees and ankles.  Her review Dr. Grant Fontanaamos's note L5 selective nerve root block technically successful but not therapeutic   Neurological: She is alert and oriented to person, place, and time.    MRI demonstrates recurrent disc herniation L4-5.  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 She did have some trochanteric bursitis. She had no help from the selective nerve root block.  We discussed options including living with her symptoms with pharmacologic management she does not feel she can tolerate within therefore discussed operative options which include a revision lumbar decompression versus a revision lumbar decompression and a fusion. She has been very little to no back pain. We discussed revision decompression. She did well after her initial surgery in August until she had an episode in December where she turned to pulling a cord and then had the acute onset of recurrent pain. Discussed utilizing a brace perhaps postoperatively. I would recommend keeping her out of work. We discussed the possibility again of recurrent disc herniation require require a fusion.  She does have underlying probable  osteopenia.  Plan revision microlumbar decompression L4-5  Dorothy Spark., PA-C for Dr. Shelle Iron 04/03/2017, 8:52 AM

## 2017-04-03 NOTE — Patient Instructions (Signed)
[name] [name]  04/03/2017   Your procedure is scheduled on: 04-16-17  [facility]  Report to admitting at    0730 AM   Call this number if you have problems the morning of surgery [phone]    Remember: Do not eat food or drink liquids :After Midnight.     Take these medicines the morning of surgery with A SIP OF WATER: metoprolol, gabapentin, diltiazem                                You may not have any metal on your body including hair pins and              piercings  Do not wear jewelry, make-up, lotions, powders or perfumes, deodorant             Do not wear nail polish.  Do not shave  48 hours prior to surgery.               Do not bring valuables to the hospital. [facility] IS NOT             RESPONSIBLE   FOR VALUABLES.  Contacts, dentures or bridgework may not be worn into surgery.  Leave suitcase in the car. After surgery it may be brought to your room.                 Please read over the following fact sheets you were given: _____________________________________________________________________          [facility] - Preparing for [facility] Before surgery, you can play an important role.  Because skin is not sterile, your skin needs to be as free of germs as possible.  You can reduce the number of germs on your skin by washing with CHG (chlorahexidine gluconate) soap before surgery.  CHG is an antiseptic cleaner which kills germs and bonds with the skin to continue killing germs even after washing. Please DO NOT use if you have an allergy to CHG or antibacterial soaps.  If your skin becomes reddened/irritated stop using the CHG and inform your nurse when you arrive at Short Stay. Do not shave (including legs and underarms) for at least 48 hours prior to the first CHG shower.  You may shave your face/neck. Please follow these instructions carefully:  1.  Shower with CHG Soap the night before surgery and the  morning of  Surgery.  2.  If you choose to wash your hair, wash your hair first as usual with your  normal  shampoo.  3.  After you shampoo, rinse your hair and body thoroughly to remove the  shampoo.                           4.  Use CHG as you would any other liquid soap.  You can apply chg directly  to the skin and wash                       Gently with a scrungie or clean washcloth.  5.  Apply the CHG Soap to your body ONLY FROM THE NECK DOWN.   Do not use on face/ open  Wound or open sores. Avoid contact with eyes, ears mouth and genitals (private parts).                       Wash face,  Genitals (private parts) with your normal soap.             6.  Wash thoroughly, paying special attention to the area where your surgery  will be performed.  7.  Thoroughly rinse your body with warm water from the neck down.  8.  DO NOT shower/wash with your normal soap after using and rinsing off  the CHG Soap.                9.  Pat yourself dry with a clean towel.            10.  Wear clean pajamas.            11.  Place clean sheets on your bed the night of your first shower and do not  sleep with pets. Day of Surgery : Do not apply any lotions/deodorants the morning of surgery.  Please wear clean clothes to the hospital/surgery center.  FAILURE TO FOLLOW THESE INSTRUCTIONS MAY RESULT IN THE CANCELLATION OF YOUR SURGERY PATIENT SIGNATURE_________________________________  NURSE SIGNATURE__________________________________  ________________________________________________________________________   [name] [name]  An incentive spirometer is a tool that can help keep your lungs clear and active. This tool measures how well you are filling your lungs with each breath. Taking long deep breaths may help reverse or decrease the chance of developing breathing (pulmonary) problems (especially infection) following:  A long period of time when you are unable to move or be active. BEFORE  THE PROCEDURE   If the spirometer includes an indicator to show your best effort, your nurse or respiratory therapist will set it to a desired goal.  If possible, sit up straight or lean slightly forward. Try not to slouch.  Hold the incentive spirometer in an upright position. INSTRUCTIONS FOR USE  1. Sit on the edge of your bed if possible, or sit up as far as you can in bed or on a chair. 2. Hold the incentive spirometer in an upright position. 3. Breathe out normally. 4. Place the mouthpiece in your mouth and seal your lips tightly around it. 5. Breathe in slowly and as deeply as possible, raising the piston or the ball toward the top of the column. 6. Hold your breath for 3-5 seconds or for as long as possible. Allow the piston or ball to fall to the bottom of the column. 7. Remove the mouthpiece from your mouth and breathe out normally. 8. Rest for a few seconds and repeat Steps 1 through 7 at least 10 times every 1-2 hours when you are awake. Take your time and take a few normal breaths between deep breaths. 9. The spirometer may include an indicator to show your best effort. Use the indicator as a goal to work toward during each repetition. 10. After each set of 10 deep breaths, practice coughing to be sure your lungs are clear. If you have an incision (the cut made at the time of surgery), support your incision when coughing by placing a pillow or rolled up towels firmly against it. Once you are able to get out of bed, walk around indoors and cough well. You may stop using the incentive spirometer when instructed by your caregiver.  RISKS AND COMPLICATIONS  Take your time so you do not get  dizzy or light-headed.  If you are in pain, you may need to take or ask for pain medication before doing incentive spirometry. It is harder to take a deep breath if you are having pain. AFTER USE  Rest and breathe slowly and easily.  It can be helpful to keep track of a log of your progress.  Your caregiver can provide you with a simple table to help with this. If you are using the spirometer at home, follow these instructions: [name] [name] IF:   You are having difficultly using the spirometer.  You have trouble using the spirometer as often as instructed.  Your pain medication is not giving enough relief while using the spirometer.  You develop fever of 100.5 F (38.1 C) or higher. SEEK IMMEDIATE MEDICAL CARE IF:   You cough up bloody sputum that had not been present before.  You develop fever of 102 F (38.9 C) or greater.  You develop worsening pain at or near the incision site. MAKE SURE YOU:   Understand these instructions.  Will watch your condition.  Will get help right away if you are not doing well or get worse. Document Released: 07/07/2006 Document Revised: 05/19/2011 Document Reviewed: 09/07/2006 [facility] Patient Information 2014 [place], [place].   ________________________________________________________________________

## 2017-04-03 NOTE — Progress Notes (Signed)
ekg 10-29-16 epic

## 2017-04-06 ENCOUNTER — Other Ambulatory Visit: Payer: Self-pay

## 2017-04-06 ENCOUNTER — Encounter (HOSPITAL_COMMUNITY): Payer: Self-pay

## 2017-04-06 ENCOUNTER — Ambulatory Visit: Payer: Self-pay | Admitting: Orthopedic Surgery

## 2017-04-06 ENCOUNTER — Encounter (HOSPITAL_COMMUNITY)
Admission: RE | Admit: 2017-04-06 | Discharge: 2017-04-06 | Disposition: A | Discharge status: Home / Self Care | Payer: 59 | Source: Ambulatory Visit | Attending: Specialist | Admitting: Specialist

## 2017-04-06 ENCOUNTER — Ambulatory Visit (HOSPITAL_COMMUNITY)
Admission: RE | Admit: 2017-04-06 | Discharge: 2017-04-06 | Disposition: A | Discharge status: Home / Self Care | Payer: 59 | Source: Ambulatory Visit | Attending: Orthopedic Surgery | Admitting: Orthopedic Surgery

## 2017-04-06 DIAGNOSIS — Z01818 Encounter for other preprocedural examination: Secondary | ICD-10-CM | POA: Insufficient documentation

## 2017-04-06 DIAGNOSIS — M5126 Other intervertebral disc displacement, lumbar region: Secondary | ICD-10-CM

## 2017-04-06 DIAGNOSIS — M47896 Other spondylosis, lumbar region: Secondary | ICD-10-CM | POA: Insufficient documentation

## 2017-04-06 HISTORY — DX: Pneumonia, unspecified organism: J18.9

## 2017-04-06 LAB — BASIC METABOLIC PANEL
ANION GAP: 8 (ref 5–15)
BUN: 20 mg/dL (ref 6–20)
CHLORIDE: 103 mmol/L (ref 101–111)
CO2: 32 mmol/L (ref 22–32)
Calcium: 9.1 mg/dL (ref 8.9–10.3)
Creatinine, Ser: 0.52 mg/dL (ref 0.44–1.00)
GFR calc Af Amer: >60 mL/min (ref >60)
Glucose, Bld: 104 mg/dL — ABNORMAL HIGH (ref 65–99)
POTASSIUM: 3.3 mmol/L — AB (ref 3.5–5.1)
SODIUM: 143 mmol/L (ref 135–145)

## 2017-04-06 LAB — CBC
HEMATOCRIT: 38.5 % (ref 36.0–46.0)
HEMOGLOBIN: 12.8 g/dL (ref 12.0–15.0)
MCH: 30.8 pg (ref 26.0–34.0)
MCHC: 33.2 g/dL (ref 30.0–36.0)
MCV: 92.5 fL (ref 78.0–100.0)
Platelets: 229 10*3/uL (ref 150–400)
RBC: 4.16 MIL/uL (ref 3.87–5.11)
RDW: 13.6 % (ref 11.5–15.5)
WBC: 5.2 10*3/uL (ref 4.0–10.5)

## 2017-04-06 LAB — SURGICAL PCR SCREEN
MRSA, PCR: NEGATIVE
STAPHYLOCOCCUS AUREUS: POSITIVE — AB

## 2017-04-16 ENCOUNTER — Ambulatory Visit (HOSPITAL_COMMUNITY): Payer: 59 | Admitting: Certified Registered Nurse Anesthetist

## 2017-04-16 ENCOUNTER — Ambulatory Visit (HOSPITAL_COMMUNITY): Payer: 59

## 2017-04-16 ENCOUNTER — Encounter (HOSPITAL_COMMUNITY)
Admission: RE | Disposition: A | Discharge status: Home / Self Care | Payer: Self-pay | Source: Ambulatory Visit | Attending: Specialist

## 2017-04-16 ENCOUNTER — Other Ambulatory Visit: Payer: Self-pay

## 2017-04-16 ENCOUNTER — Ambulatory Visit (HOSPITAL_COMMUNITY)
Admission: RE | Admit: 2017-04-16 | Discharge: 2017-04-17 | Disposition: A | Discharge status: Home / Self Care | Payer: 59 | Source: Ambulatory Visit | Attending: Specialist | Admitting: Specialist

## 2017-04-16 ENCOUNTER — Encounter (HOSPITAL_COMMUNITY): Payer: Self-pay

## 2017-04-16 DIAGNOSIS — Z419 Encounter for procedure for purposes other than remedying health state, unspecified: Secondary | ICD-10-CM

## 2017-04-16 DIAGNOSIS — Z79899 Other long term (current) drug therapy: Secondary | ICD-10-CM | POA: Diagnosis not present

## 2017-04-16 DIAGNOSIS — M5136 Other intervertebral disc degeneration, lumbar region: Secondary | ICD-10-CM | POA: Diagnosis present

## 2017-04-16 DIAGNOSIS — Z86718 Personal history of other venous thrombosis and embolism: Secondary | ICD-10-CM | POA: Diagnosis not present

## 2017-04-16 DIAGNOSIS — M48061 Spinal stenosis, lumbar region without neurogenic claudication: Secondary | ICD-10-CM | POA: Diagnosis not present

## 2017-04-16 DIAGNOSIS — M7060 Trochanteric bursitis, unspecified hip: Secondary | ICD-10-CM | POA: Diagnosis not present

## 2017-04-16 DIAGNOSIS — M5116 Intervertebral disc disorders with radiculopathy, lumbar region: Secondary | ICD-10-CM | POA: Insufficient documentation

## 2017-04-16 DIAGNOSIS — I10 Essential (primary) hypertension: Secondary | ICD-10-CM | POA: Insufficient documentation

## 2017-04-16 DIAGNOSIS — R011 Cardiac murmur, unspecified: Secondary | ICD-10-CM | POA: Insufficient documentation

## 2017-04-16 DIAGNOSIS — M51369 Other intervertebral disc degeneration, lumbar region without mention of lumbar back pain or lower extremity pain: Secondary | ICD-10-CM | POA: Diagnosis present

## 2017-04-16 DIAGNOSIS — M1990 Unspecified osteoarthritis, unspecified site: Secondary | ICD-10-CM | POA: Diagnosis not present

## 2017-04-16 DIAGNOSIS — M5126 Other intervertebral disc displacement, lumbar region: Secondary | ICD-10-CM

## 2017-04-16 HISTORY — PX: LUMBAR LAMINECTOMY/DECOMPRESSION MICRODISCECTOMY: SHX5026

## 2017-04-16 SURGERY — LUMBAR LAMINECTOMY/DECOMPRESSION MICRODISCECTOMY 1 LEVEL
Anesthesia: General | Site: Back

## 2017-04-16 MED ORDER — FENTANYL CITRATE (PF) 100 MCG/2ML IJ SOLN
INTRAMUSCULAR | Status: DC | PRN
  Administered 2017-04-16: 100 ug via INTRAVENOUS
  Administered 2017-04-16 (×3): 50 ug via INTRAVENOUS

## 2017-04-16 MED ORDER — EPHEDRINE SULFATE-NACL 50-0.9 MG/10ML-% IV SOSY
PREFILLED_SYRINGE | INTRAVENOUS | Status: DC | PRN
  Administered 2017-04-16: 10 mg via INTRAVENOUS
  Administered 2017-04-16 (×2): 5 mg via INTRAVENOUS
  Administered 2017-04-16: 10 mg via INTRAVENOUS

## 2017-04-16 MED ORDER — EPHEDRINE 5 MG/ML INJ
INTRAVENOUS | Status: AC
  Filled 2017-04-16: qty 10

## 2017-04-16 MED ORDER — DEXAMETHASONE SODIUM PHOSPHATE 10 MG/ML IJ SOLN
INTRAMUSCULAR | Status: DC | PRN
  Administered 2017-04-16: 10 mg via INTRAVENOUS

## 2017-04-16 MED ORDER — HYDROCHLOROTHIAZIDE 25 MG PO TABS
25.0000 mg | ORAL_TABLET | Freq: Every day | ORAL | Status: DC
Start: 2017-04-17 — End: 2017-04-17
  Administered 2017-04-17: 10:00:00 25 mg via ORAL
  Filled 2017-04-16: qty 1

## 2017-04-16 MED ORDER — MENTHOL 3 MG MT LOZG: 1.0000 | LOZENGE | OROMUCOSAL | Status: DC | PRN

## 2017-04-16 MED ORDER — LIDOCAINE 2% (20 MG/ML) 5 ML SYRINGE
INTRAMUSCULAR | Status: AC
  Filled 2017-04-16: qty 5

## 2017-04-16 MED ORDER — PROPOFOL 10 MG/ML IV BOLUS
INTRAVENOUS | Status: DC | PRN
  Administered 2017-04-16: 150 mg via INTRAVENOUS

## 2017-04-16 MED ORDER — RISAQUAD PO CAPS
1.0000 | ORAL_CAPSULE | Freq: Every day | ORAL | Status: DC
  Administered 2017-04-17: 1 via ORAL
  Filled 2017-04-16: qty 1

## 2017-04-16 MED ORDER — POTASSIUM CHLORIDE CRYS ER 10 MEQ PO TBCR
10.0000 meq | EXTENDED_RELEASE_TABLET | Freq: Two times a day (BID) | ORAL | Status: DC
  Administered 2017-04-16 – 2017-04-17 (×2): 10 meq via ORAL
  Filled 2017-04-16 (×2): qty 1

## 2017-04-16 MED ORDER — DOCUSATE SODIUM 100 MG PO CAPS
100.0000 mg | ORAL_CAPSULE | Freq: Two times a day (BID) | ORAL | Status: DC
  Administered 2017-04-16 – 2017-04-17 (×2): 100 mg via ORAL
  Filled 2017-04-16 (×2): qty 1

## 2017-04-16 MED ORDER — ONDANSETRON HCL 4 MG/2ML IJ SOLN
4.0000 mg | Freq: Four times a day (QID) | INTRAMUSCULAR | Status: DC | PRN
Start: 2017-04-16 — End: 2017-04-17
  Administered 2017-04-16: 4 mg via INTRAVENOUS
  Filled 2017-04-16: qty 2

## 2017-04-16 MED ORDER — THROMBIN (RECOMBINANT) 5000 UNITS EX SOLR
CUTANEOUS | Status: AC
  Filled 2017-04-16: qty 10000

## 2017-04-16 MED ORDER — OXYCODONE HCL 5 MG PO TABS
10.0000 mg | ORAL_TABLET | ORAL | Status: DC | PRN
  Administered 2017-04-16: 22:00:00 10 mg via ORAL
  Filled 2017-04-16: qty 2

## 2017-04-16 MED ORDER — ACETAMINOPHEN 10 MG/ML IV SOLN
1000.0000 mg | INTRAVENOUS | Status: AC
  Administered 2017-04-16: 1000 mg via INTRAVENOUS
  Filled 2017-04-16: qty 100

## 2017-04-16 MED ORDER — ONDANSETRON HCL 4 MG/2ML IJ SOLN
INTRAMUSCULAR | Status: DC | PRN
  Administered 2017-04-16: 4 mg via INTRAVENOUS

## 2017-04-16 MED ORDER — HYDROMORPHONE HCL 1 MG/ML IJ SOLN
0.5000 mg | INTRAMUSCULAR | Status: DC | PRN
  Administered 2017-04-16: 16:00:00 0.5 mg via INTRAVENOUS
  Filled 2017-04-16: qty 0.5

## 2017-04-16 MED ORDER — KCL IN DEXTROSE-NACL 20-5-0.45 MEQ/L-%-% IV SOLN
INTRAVENOUS | Status: DC
  Administered 2017-04-16: 18:00:00 via INTRAVENOUS
  Filled 2017-04-16 (×2): qty 1000

## 2017-04-16 MED ORDER — LIDOCAINE 2% (20 MG/ML) 5 ML SYRINGE
INTRAMUSCULAR | Status: DC | PRN
  Administered 2017-04-16: 80 mg via INTRAVENOUS

## 2017-04-16 MED ORDER — ALUM & MAG HYDROXIDE-SIMETH 200-200-20 MG/5ML PO SUSP: 30.0000 mL | Freq: Four times a day (QID) | ORAL | Status: DC | PRN

## 2017-04-16 MED ORDER — FUROSEMIDE 20 MG PO TABS: 20.0000 mg | ORAL_TABLET | Freq: Every day | ORAL | Status: DC | PRN

## 2017-04-16 MED ORDER — MIDAZOLAM HCL 5 MG/5ML IJ SOLN
INTRAMUSCULAR | Status: DC | PRN
  Administered 2017-04-16: 2 mg via INTRAVENOUS

## 2017-04-16 MED ORDER — BISACODYL 5 MG PO TBEC: 5.0000 mg | DELAYED_RELEASE_TABLET | Freq: Every day | ORAL | Status: DC | PRN

## 2017-04-16 MED ORDER — BUPIVACAINE-EPINEPHRINE 0.5% -1:200000 IJ SOLN
INTRAMUSCULAR | Status: DC | PRN
  Administered 2017-04-16: 12 mL

## 2017-04-16 MED ORDER — SUGAMMADEX SODIUM 200 MG/2ML IV SOLN
INTRAVENOUS | Status: DC | PRN
  Administered 2017-04-16: 165 mg via INTRAVENOUS

## 2017-04-16 MED ORDER — SUGAMMADEX SODIUM 200 MG/2ML IV SOLN
INTRAVENOUS | Status: AC
  Filled 2017-04-16: qty 2

## 2017-04-16 MED ORDER — METHOCARBAMOL 1000 MG/10ML IJ SOLN
500.0000 mg | Freq: Four times a day (QID) | INTRAVENOUS | Status: DC | PRN
  Filled 2017-04-16: qty 5

## 2017-04-16 MED ORDER — SODIUM CHLORIDE 0.9 % IV SOLN
INTRAVENOUS | Status: DC | PRN
  Administered 2017-04-16: 500 mL

## 2017-04-16 MED ORDER — ONDANSETRON HCL 4 MG/2ML IJ SOLN
INTRAMUSCULAR | Status: AC
  Filled 2017-04-16: qty 2

## 2017-04-16 MED ORDER — PROPOFOL 10 MG/ML IV BOLUS
INTRAVENOUS | Status: AC
  Filled 2017-04-16: qty 20

## 2017-04-16 MED ORDER — MAGNESIUM CITRATE PO SOLN: 1.0000 | Freq: Once | ORAL | Status: DC | PRN

## 2017-04-16 MED ORDER — SODIUM CHLORIDE 0.9 % IV SOLN
INTRAVENOUS | Status: AC
  Filled 2017-04-16: qty 500000

## 2017-04-16 MED ORDER — POLYETHYLENE GLYCOL 3350 17 G PO PACK: 17.0000 g | PACK | Freq: Every day | ORAL | Status: DC | PRN

## 2017-04-16 MED ORDER — IRBESARTAN 150 MG PO TABS
150.0000 mg | ORAL_TABLET | Freq: Every day | ORAL | Status: DC
  Administered 2017-04-17: 150 mg via ORAL
  Filled 2017-04-16: qty 1

## 2017-04-16 MED ORDER — DILTIAZEM HCL ER COATED BEADS 240 MG PO CP24
240.0000 mg | ORAL_CAPSULE | Freq: Every day | ORAL | Status: DC
  Administered 2017-04-17: 240 mg via ORAL
  Filled 2017-04-16: qty 1

## 2017-04-16 MED ORDER — ONDANSETRON HCL 4 MG PO TABS: 4.0000 mg | ORAL_TABLET | Freq: Four times a day (QID) | ORAL | Status: DC | PRN

## 2017-04-16 MED ORDER — LACTATED RINGERS IV SOLN
INTRAVENOUS | Status: DC
  Administered 2017-04-16 (×3): via INTRAVENOUS

## 2017-04-16 MED ORDER — CLINDAMYCIN PHOSPHATE 900 MG/50ML IV SOLN
900.0000 mg | INTRAVENOUS | Status: AC
  Administered 2017-04-16: 900 mg via INTRAVENOUS
  Filled 2017-04-16: qty 50

## 2017-04-16 MED ORDER — HYDROMORPHONE HCL 1 MG/ML IJ SOLN
INTRAMUSCULAR | Status: AC
  Filled 2017-04-16: qty 2

## 2017-04-16 MED ORDER — FENTANYL CITRATE (PF) 250 MCG/5ML IJ SOLN
INTRAMUSCULAR | Status: AC
Start: 2017-04-16 — End: 2017-04-16
  Filled 2017-04-16: qty 5

## 2017-04-16 MED ORDER — THROMBIN (RECOMBINANT) 5000 UNITS EX SOLR
OROMUCOSAL | Status: DC | PRN
  Administered 2017-04-16: 10 mL via TOPICAL

## 2017-04-16 MED ORDER — PHENYLEPHRINE 40 MCG/ML (10ML) SYRINGE FOR IV PUSH (FOR BLOOD PRESSURE SUPPORT)
PREFILLED_SYRINGE | INTRAVENOUS | Status: AC
  Filled 2017-04-16: qty 10

## 2017-04-16 MED ORDER — MIDAZOLAM HCL 2 MG/2ML IJ SOLN
INTRAMUSCULAR | Status: AC
  Filled 2017-04-16: qty 2

## 2017-04-16 MED ORDER — POLYETHYLENE GLYCOL 3350 17 G PO PACK: 17.0000 g | PACK | Freq: Every day | ORAL | Status: DC

## 2017-04-16 MED ORDER — BUPIVACAINE-EPINEPHRINE (PF) 0.5% -1:200000 IJ SOLN
INTRAMUSCULAR | Status: AC
  Filled 2017-04-16: qty 30

## 2017-04-16 MED ORDER — DOCUSATE SODIUM 100 MG PO CAPS: 100.0000 mg | ORAL_CAPSULE | Freq: Two times a day (BID) | ORAL | Status: DC | PRN

## 2017-04-16 MED ORDER — ACETAMINOPHEN 325 MG PO TABS: 650.0000 mg | ORAL_TABLET | ORAL | Status: DC | PRN

## 2017-04-16 MED ORDER — ROCURONIUM BROMIDE 50 MG/5ML IV SOSY
PREFILLED_SYRINGE | INTRAVENOUS | Status: DC | PRN
  Administered 2017-04-16: 50 mg via INTRAVENOUS
  Administered 2017-04-16: 10 mg via INTRAVENOUS

## 2017-04-16 MED ORDER — METOPROLOL SUCCINATE ER 50 MG PO TB24
150.0000 mg | ORAL_TABLET | Freq: Every day | ORAL | Status: DC
  Administered 2017-04-17: 10:00:00 150 mg via ORAL
  Filled 2017-04-16: qty 3

## 2017-04-16 MED ORDER — PHENYLEPHRINE 40 MCG/ML (10ML) SYRINGE FOR IV PUSH (FOR BLOOD PRESSURE SUPPORT)
PREFILLED_SYRINGE | INTRAVENOUS | Status: DC | PRN
  Administered 2017-04-16 (×3): 80 ug via INTRAVENOUS
  Administered 2017-04-16: 120 ug via INTRAVENOUS

## 2017-04-16 MED ORDER — PHENOL 1.4 % MT LIQD
1.0000 | OROMUCOSAL | Status: DC | PRN
  Filled 2017-04-16: qty 177

## 2017-04-16 MED ORDER — ARTIFICIAL TEARS OP OINT
TOPICAL_OINTMENT | OPHTHALMIC | Status: AC
  Filled 2017-04-16: qty 3.5

## 2017-04-16 MED ORDER — CEFAZOLIN SODIUM-DEXTROSE 2-4 GM/100ML-% IV SOLN
2.0000 g | Freq: Three times a day (TID) | INTRAVENOUS | Status: AC
  Administered 2017-04-16 – 2017-04-17 (×3): 2 g via INTRAVENOUS
  Filled 2017-04-16 (×3): qty 100

## 2017-04-16 MED ORDER — OXYCODONE-ACETAMINOPHEN 5-325 MG PO TABS: 1.0000 | ORAL_TABLET | ORAL | 0 refills | Status: DC | PRN

## 2017-04-16 MED ORDER — ACETAMINOPHEN 650 MG RE SUPP: 650.0000 mg | RECTAL | Status: DC | PRN

## 2017-04-16 MED ORDER — METHOCARBAMOL 500 MG PO TABS
500.0000 mg | ORAL_TABLET | Freq: Four times a day (QID) | ORAL | Status: DC | PRN
  Administered 2017-04-16 – 2017-04-17 (×2): 500 mg via ORAL
  Filled 2017-04-16 (×2): qty 1

## 2017-04-16 MED ORDER — CEFAZOLIN SODIUM-DEXTROSE 2-4 GM/100ML-% IV SOLN
2.0000 g | INTRAVENOUS | Status: AC
  Administered 2017-04-16: 2 g via INTRAVENOUS
  Filled 2017-04-16: qty 100

## 2017-04-16 MED ORDER — GABAPENTIN 300 MG PO CAPS
600.0000 mg | ORAL_CAPSULE | Freq: Three times a day (TID) | ORAL | Status: DC
  Administered 2017-04-16 – 2017-04-17 (×3): 600 mg via ORAL
  Filled 2017-04-16 (×3): qty 2

## 2017-04-16 MED ORDER — HYDROMORPHONE HCL 1 MG/ML IJ SOLN
0.2500 mg | INTRAMUSCULAR | Status: DC | PRN
  Administered 2017-04-16 (×3): 0.5 mg via INTRAVENOUS

## 2017-04-16 MED ORDER — OXYCODONE HCL 5 MG PO TABS
5.0000 mg | ORAL_TABLET | ORAL | Status: DC | PRN
  Administered 2017-04-16 – 2017-04-17 (×2): 5 mg via ORAL
  Filled 2017-04-16 (×2): qty 1

## 2017-04-16 MED ORDER — ONDANSETRON HCL 4 MG/2ML IJ SOLN: 4.0000 mg | Freq: Once | INTRAMUSCULAR | Status: DC | PRN

## 2017-04-16 MED ORDER — ACETAMINOPHEN 500 MG PO TABS: 1000.0000 mg | ORAL_TABLET | Freq: Every day | ORAL | Status: DC | PRN

## 2017-04-16 MED ORDER — MEPERIDINE HCL 50 MG/ML IJ SOLN: 6.2500 mg | INTRAMUSCULAR | Status: DC | PRN

## 2017-04-16 MED ORDER — PHENYLEPHRINE HCL 10 MG/ML IJ SOLN
INTRAVENOUS | Status: DC | PRN
  Administered 2017-04-16: 40 ug/min via INTRAVENOUS

## 2017-04-16 SURGICAL SUPPLY — 49 items
BAG ZIPLOCK 12X15 (MISCELLANEOUS) IMPLANT
CLEANER TIP ELECTROSURG 2X2 (MISCELLANEOUS) ×3 IMPLANT
CLOSURE WOUND 1/2 X4 (GAUZE/BANDAGES/DRESSINGS) ×2
CLOTH 2% CHLOROHEXIDINE 3PK (PERSONAL CARE ITEMS) ×3 IMPLANT
CONT SPEC 4OZ CLIKSEAL STRL BL (MISCELLANEOUS) ×3 IMPLANT
COVER SURGICAL LIGHT HANDLE (MISCELLANEOUS) ×3 IMPLANT
DRAPE MICROSCOPE LEICA (MISCELLANEOUS) ×3 IMPLANT
DRAPE POUCH INSTRU U-SHP 10X18 (DRAPES) ×3 IMPLANT
DRAPE SHEET LG 3/4 BI-LAMINATE (DRAPES) ×3 IMPLANT
DRAPE SURG 17X11 SM STRL (DRAPES) ×3 IMPLANT
DRAPE UTILITY XL STRL (DRAPES) ×3 IMPLANT
DRSG AQUACEL AG ADV 3.5X 4 (GAUZE/BANDAGES/DRESSINGS) ×3 IMPLANT
DRSG AQUACEL AG ADV 3.5X 6 (GAUZE/BANDAGES/DRESSINGS) IMPLANT
DURAPREP 26ML APPLICATOR (WOUND CARE) ×3 IMPLANT
DURASEAL SPINE SEALANT 3ML (MISCELLANEOUS) IMPLANT
ELECT BLADE TIP CTD 4 INCH (ELECTRODE) IMPLANT
ELECT REM PT RETURN 15FT ADLT (MISCELLANEOUS) ×3 IMPLANT
GLOVE BIOGEL PI IND STRL 7.0 (GLOVE) ×1 IMPLANT
GLOVE BIOGEL PI INDICATOR 7.0 (GLOVE) ×2
GLOVE SURG SS PI 7.0 STRL IVOR (GLOVE) ×6 IMPLANT
GLOVE SURG SS PI 7.5 STRL IVOR (GLOVE) ×3 IMPLANT
GLOVE SURG SS PI 8.0 STRL IVOR (GLOVE) ×6 IMPLANT
GOWN STRL REUS W/TWL XL LVL3 (GOWN DISPOSABLE) ×9 IMPLANT
HEMOSTAT SPONGE AVITENE ULTRA (HEMOSTASIS) IMPLANT
IV CATH 14GX2 1/4 (CATHETERS) ×3 IMPLANT
KIT BASIN OR (CUSTOM PROCEDURE TRAY) ×3 IMPLANT
KIT POSITIONING SURG ANDREWS (MISCELLANEOUS) ×3 IMPLANT
MANIFOLD NEPTUNE II (INSTRUMENTS) ×3 IMPLANT
NEEDLE SPNL 18GX3.5 QUINCKE PK (NEEDLE) ×6 IMPLANT
PACK LAMINECTOMY ORTHO (CUSTOM PROCEDURE TRAY) ×3 IMPLANT
PATTIES SURGICAL .5 X.5 (GAUZE/BANDAGES/DRESSINGS) IMPLANT
PATTIES SURGICAL .75X.75 (GAUZE/BANDAGES/DRESSINGS) IMPLANT
PATTIES SURGICAL 1X1 (DISPOSABLE) IMPLANT
RUBBERBAND STERILE (MISCELLANEOUS) ×6 IMPLANT
SPONGE SURGIFOAM ABS GEL 100 (HEMOSTASIS) ×3 IMPLANT
STAPLER VISISTAT (STAPLE) IMPLANT
STRIP CLOSURE SKIN 1/2X4 (GAUZE/BANDAGES/DRESSINGS) ×4 IMPLANT
SUT NURALON 4 0 TR CR/8 (SUTURE) IMPLANT
SUT PROLENE 3 0 PS 2 (SUTURE) ×6 IMPLANT
SUT VIC AB 1 CT1 27 (SUTURE)
SUT VIC AB 1 CT1 27XBRD ANTBC (SUTURE) IMPLANT
SUT VIC AB 1-0 CT2 27 (SUTURE) ×3 IMPLANT
SUT VIC AB 2-0 CT1 27 (SUTURE)
SUT VIC AB 2-0 CT1 TAPERPNT 27 (SUTURE) IMPLANT
SUT VIC AB 2-0 CT2 27 (SUTURE) ×6 IMPLANT
SYR 3ML LL SCALE MARK (SYRINGE) IMPLANT
TOWEL OR 17X26 10 PK STRL BLUE (TOWEL DISPOSABLE) ×3 IMPLANT
TOWEL OR NON WOVEN STRL DISP B (DISPOSABLE) IMPLANT
YANKAUER SUCT BULB TIP NO VENT (SUCTIONS) IMPLANT

## 2017-04-16 NOTE — Op Note (Signed)
NAMEOLIVIAROSE, PUNCH NO.:  [id number]  MEDICAL RECORD NO.:  [id number]  LOCATION:                                 FACILITY:  PHYSICIAN:  Jene Every, M.D.         DATE OF BIRTH:  DATE OF PROCEDURE:  04/16/2017 DATE OF DISCHARGE:                              OPERATIVE REPORT   PREOPERATIVE DIAGNOSIS:  Spinal stenosis, recurrent disk herniation, L4- 5.  POSTOPERATIVE DIAGNOSIS:  Spinal stenosis, recurrent disk herniation, L4- 5.  PROCEDURE PERFORMED:  Revision microlumbar decompression, L4-5 with bilateral hemilaminotomy and lateral recess decompression with foraminotomy, L4-L5.  ANESTHESIA:  General.  ASSISTANT:  Lanna Poche, PA.  SPECIMEN:  L4-5 disk to pathology.  HISTORY:  This is a 67 year old female, did well after a disk decompression in the summer, had an injury in December to her back, recurrent radicular pain, MRI indicating recurrent disk herniation, indicated for revision microlumbar decompression failing conservative treatment.  Risks and benefits discussed including bleeding, infection, damage to the neurovascular structures, no change in symptoms, worsening symptoms, DVT, PE, anesthetic complications, need for fusion in the future, etc.  TECHNIQUE:  Placed in supine position.  After induction of adequate general anesthesia and 2 g of Kefzol, she was placed prone on the [place] frame.  All bony prominences were well padded.  Lumbar region was prepped and draped in usual sterile fashion.  Previous surgical incision was utilized.  After prepping and draping, we utilized the incision.  Subcutaneous tissue was dissected.  Electrocautery was utilized to achieve hemostasis.  Dorsolumbar fascia was divided in line with the skin incision.  Initially, paraspinous musculature was elevated from lamina only on the left at L4-5 and McCullough retractor was placed.  Operating microscope was draped and brought on the surgical field.  The  patient has extensive scar tissue and a very small interlaminar window with what appeared to be ossification of the previous region of decompression.  Given that it was not amenable to a hemilaminotomy, I converted to a central decompression.  I elevated the paraspinous musculature on the right at L4-5, placed McCullough retractor.  Confirmatory radiograph obtained.  I then performed a central decompression by removing the interspinous ligament partial of the spinous process of L4 and L5.  I then utilized a combination of micro curette and 2 mm Kerrison to skeletonize the previous hemilaminotomy.  I extended cephalad and caudad performing hemilaminotomy of the caudad edge with a 2 and a 3 mm Kerrison cephalad to the detachment border of the ligamentum flavum.  Caudad, there was hypertrophic facet adhesed significantly to the thecal sac. Meticulously developed a plane between the thecal sac and the L5 nerve root and the previous laminotomy.  This utilizing a microcurette and a Penfield.  Following this plane being developed, the neural element was protected and decompressed lateral recess to the medial border of the pedicle performed a generous foraminotomy of L5.  Prior to this, I performed a partial medial hemifacetectomy with an osteotome.  Epidural venous plexus was noted as well.  I continued cephalad developing a plane between the facet and the thecal sac.  It was significantly adhesed.  We continued cephalad, performed a foraminotomy  of L4 preserving the pars and decompressed the lateral recess.  I identified the disk, which was herniated.  I performed an annulotomy.  Copious portion of disk material was removed from the disk space with straight and a micro pituitary and further mobilized with an Epstein and catheter lavaged.  With antibiotic irrigation, it was then again retrieved and multiple fragments were then retrieved.  A neural probe passed freely at the foramen of L4 and L5  and above the pedicle of L4.  There was 1 cm of excursion of the L5 root medial to the pedicle without tension.  Bipolar electrocautery was utilized to achieve hemostasis as well as thrombin- soaked Gelfoam.  Confirmatory radiograph obtained with a marker in the disk space.  I felt there was good mobility, decompression of the L4 root.  Hemilaminotomy of the caudad edge of L5 had been performed as well, free end of multiple dural adhesions.  No evidence of CSF leakage or active bleeding.  I removed the McCullough retractor, copiously irrigated the wound.  We closed the dorsolumbar fascia 1 Vicryl, subcu with 2-0, and skin with Prolene.  Sterile dressing applied.  Placed supine on the hospital bed, extubated without difficulty, and transported to the recovery room in satisfactory condition.  The patient tolerated the procedure well.  No complications.  Minimal blood loss.     Jene EveryJeffrey Izella Ybanez, M.D.     Cordelia PenJB/MEDQ  D:  04/16/2017  T:  04/16/2017  Job:  [id number]

## 2017-04-16 NOTE — Brief Op Note (Signed)
04/16/2017  12:10 PM  PATIENT:  [name] DubsMarilyn P [name]  67 y.o. 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  PROCEDURE:  Procedure(s) with comments: Revision microlumbar decompression L4-5 (N/A) - 2 hrs  SURGEON:  Surgeon(s) and Role:    Jene Every* Lesta Limbert, MD - Primary  PHYSICIAN ASSISTANT:   ASSISTANTS: Bissell   ANESTHESIA:   general  EBL:  100 mL   BLOOD ADMINISTERED:none  DRAINS: none   LOCAL MEDICATIONS USED:  MARCAINE     SPECIMEN:  Source of Specimen:  L45  DISPOSITION OF SPECIMEN:  PATHOLOGY  COUNTS:  YES  TOURNIQUET:  * No tourniquets in log *  DICTATION: .Other Dictation: Dictation Number [id number]  PLAN OF CARE: Admit for overnight observation  PATIENT DISPOSITION:  PACU - hemodynamically stable.   Delay start of Pharmacological VTE agent (>24hrs) due to surgical blood loss or risk of bleeding: yes

## 2017-04-16 NOTE — Interval H&P Note (Signed)
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 After consideration of risks, benefits and other options for treatment, the patient has consented to  Procedure(s) with comments: Revision microlumbar decompression L4-5 (N/A) - 2 hrs as a surgical intervention .  [name] history has been reviewed, patient examined, no change in status, stable for surgery.  I have reviewed the patient's chart and labs.  Questions were answered to the patient's satisfaction.     Velicia Dejager C

## 2017-04-16 NOTE — Anesthesia Postprocedure Evaluation (Signed)
Anesthesia Post Note  Patient: [name] [name]  Procedure(s) Performed: Revision microlumbar decompression L4-5 (N/A Back)     Patient location during evaluation: PACU Anesthesia Type: General Level of consciousness: awake and alert Pain management: pain level controlled Vital Signs Assessment: post-procedure vital signs reviewed and stable Respiratory status: spontaneous breathing, nonlabored ventilation, respiratory function stable and patient connected to nasal cannula oxygen Cardiovascular status: blood pressure returned to baseline and stable Postop Assessment: no apparent nausea or vomiting Anesthetic complications: no    Last Vitals:  Vitals:   04/16/17 1400 04/16/17 1427  BP: (!) 141/74 (!) 154/68  Pulse: (!) 55 (!) 57  Resp: 10 12  Temp: 36.5 C (!) 36.4 C  SpO2: 98% 100%    Last Pain:  Vitals:   04/16/17 1400  TempSrc:   PainSc: Asleep                 [name] DAVID

## 2017-04-16 NOTE — Discharge Instructions (Signed)

## 2017-04-16 NOTE — Anesthesia Preprocedure Evaluation (Signed)
Anesthesia Evaluation  Patient identified by MRN, date of birth, ID band Patient awake    Reviewed: Allergy & Precautions, NPO status , Patient's Chart, lab work & pertinent test results  Airway Mallampati: I  TM Distance: >3 FB Neck ROM: Full    Dental   Pulmonary    Pulmonary exam normal        Cardiovascular hypertension, Pt. on medications Normal cardiovascular exam     Neuro/Psych    GI/Hepatic   Endo/Other    Renal/GU      Musculoskeletal   Abdominal   Peds  Hematology   Anesthesia Other Findings   Reproductive/Obstetrics                             Anesthesia Physical Anesthesia Plan  ASA: II  Anesthesia Plan: General   Post-op Pain Management:    Induction: Intravenous  PONV Risk Score and Plan: 3 and Ondansetron, Dexamethasone and Midazolam  Airway Management Planned: Oral ETT  Additional Equipment:   Intra-op Plan:   Post-operative Plan: Extubation in OR  Informed Consent: I have reviewed the patients History and Physical, chart, labs and discussed the procedure including the risks, benefits and alternatives for the proposed anesthesia with the patient or authorized representative who has indicated his/her understanding and acceptance.       Plan Discussed with: CRNA and Surgeon  Anesthesia Plan Comments:         Anesthesia Quick Evaluation  

## 2017-04-16 NOTE — Transfer of Care (Signed)
Immediate Anesthesia Transfer of Care Note  Patient: Marcella DubsMarilyn P Siverson  Procedure(s) Performed: Revision microlumbar decompression L4-5 (N/A Back)  Patient Location: PACU  Anesthesia Type:General  Level of Consciousness: drowsy and patient cooperative  Airway & Oxygen Therapy: Patient Spontanous Breathing and Patient connected to face mask oxygen  Post-op Assessment: Report given to RN and Post -op Vital signs reviewed and stable  Post vital signs: Reviewed and stable  Last Vitals:  Vitals:   04/16/17 0750  BP: (!) 170/74  Pulse: 63  Resp: 16  Temp: 36.8 C  SpO2: 97%    Last Pain:  Vitals:   04/16/17 0805  TempSrc:   PainSc: 7       Patients Stated Pain Goal: 5 (04/16/17 0805)  Complications: No apparent anesthesia complications

## 2017-04-16 NOTE — Progress Notes (Signed)
PT Cancellation Note  Patient Details Name: [name] DubsMarilyn P [name] MRN: [id number] DOB: 08-Aug-1950   Cancelled Treatment:     PT order received but eval deferred this date at pt request 2* ongoing nausea/pain.  Will follow.   Brant Peets 04/16/2017, 4:55 PM

## 2017-04-16 NOTE — Anesthesia Procedure Notes (Signed)
Procedure Name: Intubation Date/Time: 04/16/2017 9:38 AM Performed by: Montel Clock, CRNA Pre-anesthesia Checklist: Patient identified, Emergency Drugs available, Suction available, Patient being monitored and Timeout performed Patient Re-evaluated:Patient Re-evaluated prior to induction Oxygen Delivery Method: Circle system utilized Preoxygenation: Pre-oxygenation with 100% oxygen Induction Type: IV induction Ventilation: Mask ventilation without difficulty and Oral airway inserted - appropriate to patient size Laryngoscope Size: Mac and 3 Grade View: Grade I Tube type: Oral Tube size: 7.0 mm Number of attempts: 1 Airway Equipment and Method: Stylet Placement Confirmation: ETT inserted through vocal cords under direct vision,  positive ETCO2 and breath sounds checked- equal and bilateral Secured at: 21 cm Tube secured with: Tape Dental Injury: Teeth and Oropharynx as per pre-operative assessment

## 2017-04-17 ENCOUNTER — Encounter (HOSPITAL_COMMUNITY): Payer: Self-pay | Admitting: Specialist

## 2017-04-17 DIAGNOSIS — M5116 Intervertebral disc disorders with radiculopathy, lumbar region: Secondary | ICD-10-CM | POA: Diagnosis not present

## 2017-04-17 LAB — BASIC METABOLIC PANEL
ANION GAP: 6 (ref 5–15)
BUN: 18 mg/dL (ref 6–20)
CO2: 29 mmol/L (ref 22–32)
Calcium: 8.5 mg/dL — ABNORMAL LOW (ref 8.9–10.3)
Chloride: 103 mmol/L (ref 101–111)
Creatinine, Ser: 0.57 mg/dL (ref 0.44–1.00)
GFR calc Af Amer: >60 mL/min (ref >60)
GLUCOSE: 171 mg/dL — AB (ref 65–99)
Potassium: 3.5 mmol/L (ref 3.5–5.1)
SODIUM: 138 mmol/L (ref 135–145)

## 2017-04-17 MED ORDER — IBUPROFEN 200 MG PO TABS: 600.0000 mg | ORAL_TABLET | Freq: Every evening | ORAL | 0 refills | Status: AC | PRN

## 2017-04-17 NOTE — Progress Notes (Signed)
   04/17/17 1004  OT Visit Information  Last OT Received On 04/17/17  Assistance Needed +1  History of Present Illness redo L4-5 decompression  Precautions  Precautions Fall;Back  Pain Assessment  Pain Score 5  Pain Location back  Pain Descriptors / Indicators Sore;Aching  Pain Intervention(s) Limited activity within patient's tolerance;Monitored during session;Repositioned  Cognition  Arousal/Alertness Awake/alert  Behavior During Therapy WFL for tasks assessed/performed  Overall Cognitive Status Within Functional Limits for tasks assessed  General Comments needs cues for back precautions; tends to move quickly  ADL  General ADL Comments pt continues to move quickly. Cued for bed mobility and back precautions for sit to stand. Simulated donning pants in bed as she could not cross RLE comfortably during evaluation. Pt had been sitting up for an hour. Still awaiting aspen brace  Bed Mobility  Overal bed mobility Needs Assistance  General bed mobility comments sit to sidelying min guard for safety  Restrictions  Weight Bearing Restrictions No  Transfers  Equipment used Rolling walker (2 wheeled)  Sit to Stand Supervision  Stand pivot transfers Supervision  General transfer comment cues for back precautions  OT - End of Session  Activity Tolerance Patient tolerated treatment well  Patient left in bed;with call bell/phone within reach  OT Assessment/Plan  Follow Up Recommendations Supervision/Assistance - 24 hour  OT Equipment None recommended by OT  AM-PAC OT "6 Clicks" Daily Activity Outcome Measure  Help from another person eating meals? 4  Help from another person taking care of personal grooming? 3  Help from another person toileting, which includes using toliet, bedpan, or urinal? 3  Help from another person bathing (including washing, rinsing, drying)? 3  Help from another person to put on and taking off regular upper body clothing? 3  Help from another person to put on  and taking off regular lower body clothing? 2  6 Click Score 18  ADL G Code Conversion CK  OT Goal Progression  Progress towards OT goals Progressing toward goals  ADL Goals  Pt Will Transfer to Toilet with supervision;ambulating;bedside commode  Pt Will Perform Tub/Shower Transfer Shower transfer;with supervision;ambulating  Additional ADL Goal #1 pt will not need any precautions during bed mobility, bathroom transfers or adls  OT Time Calculation  OT Start Time (ACUTE ONLY) 0917  OT Stop Time (ACUTE ONLY) 0927  OT Time Calculation (min) 10 min  OT General Charges  $OT Visit 1 Visit  OT Treatments  $Therapeutic Activity 8-22 mins  Marica OtterMaryellen Francys Bolin, OTR/L [phone] 04/17/2017

## 2017-04-17 NOTE — Progress Notes (Signed)
Patient d/ced home with family 

## 2017-04-17 NOTE — Progress Notes (Signed)
Subjective: 1 Day Post-Op Procedure(s) (LRB): Revision microlumbar decompression L4-5 (N/A) Patient reports pain as moderate.  Reports back pain, more than she had last time, some radicular pain into the left buttock/lateral hip but not down the leg. Denies numbness or tingling. Nausea yesterday which is now resolved, cannot tolerate Oxy on an empty stomach. No other c/o.  Objective: Vital signs in last 24 hours: Temp:  [97.5 F (36.4 C)-99.1 F (37.3 C)] 99.1 F (37.3 C) (02/08 0557) Pulse Rate:  [55-74] 74 (02/08 0811) Resp:  [10-17] 16 (02/08 0557) BP: (110-159)/(56-83) 146/65 (02/08 0557) SpO2:  [98 %-100 %] 98 % (02/08 0811)  Intake/Output from previous day: 02/07 0701 - 02/08 0700 In: 4070 [P.O.:570; I.V.:3300; IV Piggyback:200] Out: 1450 [Urine:1350; Blood:100] Intake/Output this shift: No intake/output data recorded.  No results for input(s): HGB in the last 72 hours. No results for input(s): WBC, RBC, HCT, PLT in the last 72 hours. Recent Labs    04/17/17 0610  NA 138  K 3.5  CL 103  CO2 29  BUN 18  CREATININE 0.57  GLUCOSE 171*  CALCIUM 8.5*   No results for input(s): LABPT, INR in the last 72 hours.  Neurologically intact ABD soft Neurovascular intact Sensation intact distally Intact pulses distally Dorsiflexion/Plantar flexion intact Incision: dressing C/D/I and no drainage No cellulitis present Compartment soft no calf pain or sign of DVT  Assessment/Plan: 1 Day Post-Op Procedure(s) (LRB): Revision microlumbar decompression L4-5 (N/A) Advance diet Up with therapy D/C IV fluids  Voiding trial today Back brace to be fitted today by [employer] Discussed Lspine precautions, activity modifications Plan for D/C home today after brace fitted and after passes PT as long as pain remains controlled otherwise would keep overnight for pain control and D/C tomorrow. Discussed with Dr. Elissa LovettBeane  Jasline Buskirk, Dayna BarkerJACLYN M. 04/17/2017, 8:52 AM

## 2017-04-17 NOTE — Discharge Summary (Signed)
Physician Discharge Summary   Patient ID: [name] [name] MRN: [id number] DOB/AGE: 1950/03/26 67 y.o.  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 Abdominal pain   . Arthritis   . Heart murmur   . Hypertension   . Left leg DVT ([place]) 06/2008   s/p knee surgery  . Pneumonia    Discharge Diagnoses:   Principal Problem:   HNP (herniated nucleus pulposus), lumbar  Procedure:  Procedure(s) (LRB): Revision microlumbar decompression L4-5 (N/A)   Consults: None  HPI:  see H&P    Laboratory Data: Hospital Outpatient Visit on 04/06/2017  Component Date Value Ref Range Status  . Sodium 04/06/2017 143  135 - 145 mmol/L Final  . Potassium 04/06/2017 3.3* 3.5 - 5.1 mmol/L Final  . Chloride 04/06/2017 103  101 - 111 mmol/L Final  . CO2 04/06/2017 32  22 - 32 mmol/L Final  . Glucose, Bld 04/06/2017 104* 65 - 99 mg/dL Final  . BUN 04/06/2017 20  6 - 20 mg/dL Final  . Creatinine, Ser 04/06/2017 0.52  0.44 - 1.00 mg/dL Final  . Calcium 04/06/2017 9.1  8.9 - 10.3 mg/dL Final  . GFR calc non Af Amer 04/06/2017 >60  >60 mL/min Final  . GFR calc Af Amer 04/06/2017 >60  >60 mL/min Final   Comment: (NOTE) The eGFR has been calculated using the CKD EPI equation. This calculation has not been validated in all clinical situations. eGFR's persistently <60 mL/min signify possible Chronic Kidney Disease.   . Anion gap 04/06/2017 8  5 - 15 Final  . WBC 04/06/2017 5.2  4.0 - 10.5 K/uL Final  . RBC 04/06/2017 4.16  3.87 - 5.11 MIL/uL Final  . Hemoglobin 04/06/2017 12.8  12.0 - 15.0 g/dL Final  . HCT 04/06/2017 38.5  36.0 - 46.0 % Final  . MCV 04/06/2017 92.5  78.0 - 100.0 fL Final  . MCH 04/06/2017 30.8  26.0 - 34.0 pg Final  . MCHC 04/06/2017 33.2  30.0 - 36.0 g/dL Final  . RDW 04/06/2017 13.6  11.5 - 15.5 % Final  . Platelets 04/06/2017 229  150 - 400 K/uL Final  . MRSA, PCR  04/06/2017 NEGATIVE  NEGATIVE Final  . Staphylococcus aureus 04/06/2017 POSITIVE* NEGATIVE Final   Comment: (NOTE) The Xpert SA Assay (FDA approved for NASAL specimens in patients 74 years of age and older), is one component of a comprehensive surveillance program. It is not intended to diagnose infection nor to guide or monitor treatment.    No results for input(s): HGB in the last 72 hours. No results for input(s): WBC, RBC, HCT, PLT in the last 72 hours. Recent Labs    04/17/17 0610  NA 138  K 3.5  CL 103  CO2 29  BUN 18  CREATININE 0.57  GLUCOSE 171*  CALCIUM 8.5*   No results for input(s): LABPT, INR in the last 72 hours.  X-Rays:Dg Lumbar Spine 2-3 Views  Result Date: 04/06/2017 CLINICAL DATA:  Preop for lumbar spine surgery. EXAM: LUMBAR SPINE - 2-3 VIEW COMPARISON:  10/29/2016 FINDINGS: Five lumbar type vertebral bodies. Minimal convex left lumbar spine curvature. Cholecystectomy clips. Maintenance of vertebral body height and alignment. Minimal loss of intervertebral disc height at L4-5 with facet arthropathy at this level and L5-S1. IMPRESSION: Minimal spondylosis, without acute osseous finding. Electronically Signed   By: Abigail Miyamoto M.D.   On: 04/06/2017 08:59  Dg Spine Portable 1 View  Result Date: 04/16/2017 CLINICAL DATA:  L4-5 lumbar decompression EXAM: PORTABLE SPINE - 1 VIEW COMPARISON:  Previous intraoperative films. FINDINGS: Surgical retractor in instruments are now seen at the L4-5 level. The numbering nomenclature follows that of previous images. No new focal abnormality is seen. IMPRESSION: Intraoperative localization at L4-5 Electronically Signed   By: Inez Catalina M.D.   On: 04/16/2017 12:08   Dg Spine Portable 1 View  Result Date: 04/16/2017 CLINICAL DATA:  Lumbar decompression EXAM: PORTABLE SPINE - 1 VIEW COMPARISON:  Study obtained earlier in the day FINDINGS: Cross-table lateral lumbar image labeled #2 submitted. Metallic probe tips are posterior to  the inferior aspect of the L4 vertebral body and posterior to the superior aspect of the L5 vertebral body respectively. These probes overlie the L4 and L5 spinous processes respectively. No fracture or spondylolisthesis.  The disc spaces appear normal. IMPRESSION: The probes overlie the L4 and L5 spinous processes with the more superior probe tip directed posterior to the inferior aspect of the L4 vertebral body in the more inferior probe tip directed posterior to the superior aspect of the L5 vertebral body. No fracture or spondylolisthesis. No appreciable disc space narrowing. Electronically Signed   By: Lowella Grip III M.D.   On: 04/16/2017 10:44   Dg Spine Portable 1 View  Result Date: 04/16/2017 CLINICAL DATA:  L4-5 decompression EXAM: PORTABLE SPINE - 1 VIEW COMPARISON:  04/06/2017 FINDINGS: Normal alignment. Lowest disc space L5-S1 consistent with prior x-ray report Tissue spreaders posteriorly at L4-5. Surgical instrument between the spinous processes of L4-5 IMPRESSION: L4-5 level localized. Electronically Signed   By: Franchot Gallo M.D.   On: 04/16/2017 10:18    EKG: Orders placed or performed during the hospital encounter of 10/29/16  . EKG 12 lead  . EKG 12 lead     Hospital Course: Patient was admitted to [facility] and taken to the OR and underwent the above state procedure without complications.  Patient tolerated the procedure well and was later transferred to the recovery room and then to the orthopaedic floor for postoperative care.  They were given PO and IV analgesics for pain control following their surgery.  They were given 24 hours of postoperative antibiotics.   PT was consulted postop to assist with mobility and transfers.  The patient was allowed to be WBAT with therapy and was taught back precautions. Discharge planning was consulted to help with postop disposition and equipment needs.  Patient had a fair night on the evening of surgery and started to get up  OOB with therapy on day one. Patient was seen in rounds and was ready to go home on day one.  They were given discharge instructions and dressing directions.  They were instructed on when to follow up in the office with Dr. Tonita Cong.   Diet: Regular diet Activity:WBAT; Lspine precautions Follow-up:in 10-14 days Disposition - Home Discharged Condition: good   Discharge Instructions    Call MD / Call 911   Complete by:  As directed    If you experience chest pain or shortness of breath, CALL 911 and be transported to the hospital emergency room.  If you develope a fever above 101 F, pus (white drainage) or increased drainage or redness at the wound, or calf pain, call your surgeon's office.   Constipation Prevention   Complete by:  As directed    Drink plenty of fluids.  Prune juice may be helpful.  You may  use a stool softener, such as Colace (over the counter) 100 mg twice a day.  Use MiraLax (over the counter) for constipation as needed.   Diet - low sodium heart healthy   Complete by:  As directed    Increase activity slowly as tolerated   Complete by:  As directed      Allergies as of 04/17/2017   No Known Allergies     Medication List    STOP taking these medications   diclofenac 75 MG EC tablet Commonly known as:  VOLTAREN     TAKE these medications   acetaminophen 500 MG tablet Commonly known as:  TYLENOL Take 1,000 mg by mouth daily as needed for headache.   diltiazem 240 MG 24 hr capsule Commonly known as:  CARDIZEM CD Take 240 mg by mouth daily.   docusate sodium 100 MG capsule Commonly known as:  COLACE Take 1 capsule (100 mg total) by mouth 2 (two) times daily as needed for mild constipation. What changed:  when to take this   furosemide 20 MG tablet Commonly known as:  LASIX Take 20 mg by mouth daily as needed for fluid.   gabapentin 300 MG capsule Commonly known as:  NEURONTIN Take 600 mg by mouth 3 (three) times daily.   hydrochlorothiazide 25 MG  tablet Commonly known as:  HYDRODIURIL Take 25 mg by mouth daily.   ibuprofen 200 MG tablet Commonly known as:  ADVIL,MOTRIN Take 3 tablets (600 mg total) by mouth at bedtime as needed for moderate pain. Resume 5 days post-op as needed What changed:  additional instructions   lidocaine 5 % Commonly known as:  LIDODERM Place 1 patch onto the skin daily. Remove & Discard patch within 12 hours or as directed by MD   metoprolol succinate 100 MG 24 hr tablet Commonly known as:  TOPROL-XL Take 150 mg by mouth daily. Take with or immediately following a meal.   olmesartan 20 MG tablet Commonly known as:  BENICAR Take 20 mg by mouth daily.   oxyCODONE-acetaminophen 5-325 MG tablet Commonly known as:  PERCOCET Take 1 tablet by mouth every 4 (four) hours as needed. What changed:    how much to take  reasons to take this  Another medication with the same name was removed. Continue taking this medication, and follow the directions you see here.   polyethylene glycol packet Commonly known as:  MIRALAX / GLYCOLAX Take 17 g by mouth daily.   potassium chloride 10 MEQ tablet Commonly known as:  K-DUR,KLOR-CON Take 10 mEq by mouth 2 (two) times daily.   predniSONE 10 MG (21) Tbpk tablet Commonly known as:  STERAPRED UNI-PAK 21 TAB Take by mouth daily. Take 6 tabs by mouth daily  for 2 days, then 5 tabs for 2 days, then 4 tabs for 2 days, then 3 tabs for 2 days, 2 tabs for 2 days, then 1 tab by mouth daily for 2 days      Follow-up Information    Susa Day, MD Follow up in 2 week(s).   Specialty:  Orthopedic Surgery Contact information: [address] [place] [address] [phone]           Signed: Lacie Draft PA-C Orthopaedic Surgery 04/17/2017, 1:33 PM

## 2017-04-17 NOTE — Evaluation (Signed)
Physical Therapy Evaluation Patient Details Name: [name] [name] MRN: [id number] DOB: [birth year] Today's Date: 04/17/2017   History of Present Illness  redo L4-5 decompression  Clinical Impression  Pt s/p back surgery and presents with functional mobility limitations 2* post op pain and back precautions.  Pt currently mobilizing at sup/min guard level with frequent cues to slow pace.  Pt educated on back precautions and use of brace and plans dc home this date.    Follow Up Recommendations No PT follow up    Equipment Recommendations  None recommended by PT    Recommendations for Other Services       Precautions / Restrictions Precautions Precautions: Fall;Back Required Braces or Orthoses: Spinal Brace Spinal Brace: Applied in sitting position Restrictions Weight Bearing Restrictions: No      Mobility  Bed Mobility               General bed mobility comments: Pt up on side of bed, reports min difficulty supine to sit.  Transfers Overall transfer level: Needs assistance Equipment used: Rolling walker (2 wheeled) Transfers: Sit to/from [employer] Sit to Stand: Supervision Stand pivot transfers: Supervision       General transfer comment: cues for transition position, use of UEs to self assist and adherence to back precautions  Ambulation/Gait Ambulation/Gait assistance: Min guard;Supervision Ambulation Distance (Feet): 400 Feet Assistive device: Rolling walker (2 wheeled);None Gait Pattern/deviations: Step-through pattern;Decreased step length - right;Decreased step length - left;Shuffle;Trunk flexed   Gait velocity interpretation: at or above normal speed for age/gender General Gait Details: 300' with RW and 100' sans AD.  Cues for position from RW and to slow pace  Stairs Stairs: Yes Stairs assistance: Min assist Stair Management: One rail Left;Forwards Number of Stairs: 2 General stair comments: 5  Wheelchair Mobility     Modified Rankin (Stroke Patients Only)       Balance                                             Pertinent Vitals/Pain Pain Assessment: 0-10 Pain Score: 4  Pain Location: back Pain Descriptors / Indicators: Sore;Aching Pain Intervention(s): Limited activity within patient's tolerance;Monitored during session    Home Living Family/patient expects to be discharged to:: Private residence Living Arrangements: Spouse/significant other;Children Available Help at Discharge: Family Type of Home: House Home Access: Stairs to enter Entrance Stairs-Rails: None(Door frame on L) Secretary/administrator of Steps: 2 Home Layout: One level Home Equipment: Bedside commode;Walker - 2 wheels Additional Comments: husband son and sister will assist    Prior Function Level of Independence: Independent               Hand Dominance        Extremity/Trunk Assessment   Upper Extremity Assessment Upper Extremity Assessment: Overall WFL for tasks assessed    Lower Extremity Assessment Lower Extremity Assessment: Overall WFL for tasks assessed       Communication   Communication: No difficulties  Cognition Arousal/Alertness: Awake/alert Behavior During Therapy: WFL for tasks assessed/performed Overall Cognitive Status: Within Functional Limits for tasks assessed                                 General Comments: needs cues for back precautions; tends to move quickly      General Comments  Exercises     Assessment/Plan    PT Assessment    PT Problem List         PT Treatment Interventions      PT Goals (Current goals can be found in the Care Plan section)       Frequency Min 1X/week   Barriers to discharge        Co-evaluation               AM-PAC PT "6 Clicks" Daily Activity  Outcome Measure Difficulty turning over in bed (including adjusting bedclothes, sheets and blankets)?: A Lot Difficulty moving from lying  on back to sitting on the side of the bed? : A Lot Difficulty sitting down on and standing up from a chair with arms (e.g., wheelchair, bedside commode, etc,.)?: A Little Help needed moving to and from a bed to chair (including a wheelchair)?: A Little Help needed walking in hospital room?: A Little Help needed climbing 3-5 steps with a railing? : A Little 6 Click Score: 16    End of Session Equipment Utilized During Treatment: Back brace Activity Tolerance: Patient tolerated treatment well Patient left: in chair;with call bell/phone within reach;with family/visitor present Nurse Communication: Mobility status PT Visit Diagnosis: Difficulty in walking, not elsewhere classified (R26.2)    Time: 1610-96041308-1328 PT Time Calculation (min) (ACUTE ONLY): 20 min   Charges:   PT Evaluation $PT Eval Low Complexity: 1 Low     PT G Codes:        Pg [phone]   Caileigh Canche 04/17/2017, 2:39 PM

## 2017-04-17 NOTE — Evaluation (Signed)
Occupational Therapy Evaluation Patient Details Name: [name] [name] MRN: [id number] DOB: October 02, 1950 Today's Date: 04/17/2017    History of Present Illness redo L4-5 decompression   Clinical Impression   Pt was admitted for the above sx. She had original sx in August. She tends to move quickly and needs reinforcement with back precautions. Will follow in acute settting.    Follow Up Recommendations  Supervision/Assistance - 24 hour    Equipment Recommendations  None recommended by OT    Recommendations for Other Services       Precautions / Restrictions Precautions Precautions: Fall;Back Precaution Comments: will be getting an aspen brace Restrictions Weight Bearing Restrictions: No      Mobility Bed Mobility Overal bed mobility: Needs Assistance             General bed mobility comments: rolling, sidelying to sit, min guard. Cues to move more slowly and to avoid twisting  Transfers Overall transfer level: Needs assistance Equipment used: Rolling walker (2 wheeled) Transfers: Sit to/from [employer];Stand Pivot Transfers Sit to Stand: Min guard Stand pivot transfers: Min guard       General transfer comment: for safety    Balance                                           ADL either performed or assessed with clinical judgement   ADL Overall ADL's : Needs assistance/impaired     Grooming: Set up;Oral care;Sitting       Lower Body Bathing: Minimal assistance;Sit to/from stand       Lower Body Dressing: Moderate assistance;Sit to/from stand   Toilet Transfer: Min guard;Stand-pivot;RW(chair)             General ADL Comments: pt had an order for "no brace needed" when OT saw her; later changed to [place] brace.  Pt wanted to wear underwear. She can comfortably cross LLE but not R.  Educated on alternative methods.  Left handout for back precautions     Vision         Perception     Praxis      Pertinent Vitals/Pain Pain  Assessment: 0-10 Pain Score: 8  Pain Location: back Pain Descriptors / Indicators: Sore;Aching Pain Intervention(s): Limited activity within patient's tolerance;Monitored during session;Repositioned;RN gave pain meds during session     Hand Dominance     Extremity/Trunk Assessment Upper Extremity Assessment Upper Extremity Assessment: Overall WFL for tasks assessed           Communication Communication Communication: No difficulties   Cognition Arousal/Alertness: Awake/alert Behavior During Therapy: WFL for tasks assessed/performed Overall Cognitive Status: Within Functional Limits for tasks assessed                                 General Comments: needs cues for back precautions; tends to move quickly   General Comments       Exercises     Shoulder Instructions      Home Living Family/patient expects to be discharged to:: Private residence Living Arrangements: Spouse/significant other;Children Available Help at Discharge: Family               Bathroom Shower/Tub: Walk-in [employer] resources officershower   Bathroom Toilet: Standard     Home Equipment: Bedside commode   Additional Comments: husband son and sister will assist  Prior Functioning/Environment Level of Independence: Independent                 OT Problem List: Decreased activity tolerance;Pain;Decreased knowledge of use of DME or AE;Decreased knowledge of precautions      OT Treatment/Interventions: Self-care/ADL training;DME and/or AE instruction;Patient/family education    OT Goals(Current goals can be found in the care plan section) Acute Rehab OT Goals Patient Stated Goal: decreased pain; return to independence OT Goal Formulation: With patient Time For Goal Achievement: 05/01/17 Potential to Achieve Goals: Good ADL Goals Pt Will Transfer to Toilet: with supervision;ambulating;bedside commode Pt Will Perform Tub/Shower Transfer: Shower transfer;with  supervision;ambulating Additional ADL Goal #1: pt will not need any precautions during bed mobility, bathroom transfers or adls  OT Frequency: Min 2X/week   Barriers to D/C:            Co-evaluation              AM-PAC PT "6 Clicks" Daily Activity     Outcome Measure Help from another person eating meals?: None Help from another person taking care of personal grooming?: A Little Help from another person toileting, which includes using toliet, bedpan, or urinal?: A Little Help from another person bathing (including washing, rinsing, drying)?: A Little Help from another person to put on and taking off regular upper body clothing?: A Little Help from another person to put on and taking off regular lower body clothing?: A Lot 6 Click Score: 18   End of Session    Activity Tolerance: Patient tolerated treatment well Patient left: in chair;with call bell/phone within reach  OT Visit Diagnosis: Muscle weakness (generalized) (M62.81)                Time: 1610-9604 OT Time Calculation (min): 23 min Charges:  OT General Charges $OT Visit: 1 Visit OT Evaluation $OT Eval Low Complexity: 1 Low G-Codes:     [place], OTR/L [phone] 04/17/2017  [name] [name] 04/17/2017, 10:05 AM

## 2017-08-07 ENCOUNTER — Encounter: Payer: Self-pay | Admitting: *Deleted

## 2017-08-07 DIAGNOSIS — M79605 Pain in left leg: Secondary | ICD-10-CM | POA: Insufficient documentation

## 2017-08-10 ENCOUNTER — Ambulatory Visit (INDEPENDENT_AMBULATORY_CARE_PROVIDER_SITE_OTHER): Payer: 59 | Admitting: Neurology

## 2017-08-10 ENCOUNTER — Encounter: Payer: Self-pay | Admitting: Neurology

## 2017-08-10 ENCOUNTER — Ambulatory Visit
Admission: RE | Admit: 2017-08-10 | Discharge: 2017-08-10 | Disposition: A | Discharge status: Home / Self Care | Payer: 59 | Source: Ambulatory Visit | Attending: Neurology | Admitting: Neurology

## 2017-08-10 VITALS — BP 182/92 | HR 82 | Ht 66.0 in | Wt 188.0 lb

## 2017-08-10 DIAGNOSIS — M5416 Radiculopathy, lumbar region: Secondary | ICD-10-CM

## 2017-08-10 DIAGNOSIS — M25552 Pain in left hip: Secondary | ICD-10-CM | POA: Diagnosis not present

## 2017-08-10 MED ORDER — MELOXICAM 15 MG PO TABS: 15.0000 mg | ORAL_TABLET | Freq: Every day | ORAL | 6 refills | Status: DC

## 2017-08-10 NOTE — Patient Instructions (Signed)
[facility]    Address: [address], [place], [place] [address]  Phone: [phone]   Get MRI lumbar CD to review
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
## 2017-08-11 ENCOUNTER — Telehealth: Payer: Self-pay | Admitting: *Deleted

## 2017-08-11 NOTE — Telephone Encounter (Signed)
Left message of voicemail letting her know the x-ray was normal.  Provided our number to call back with any questions.

## 2017-08-11 NOTE — Telephone Encounter (Signed)
-----   Message from Levert FeinsteinYijun Yan, MD sent at 08/10/2017  5:13 PM EDT ----- Please call patient for normal x ray of left hip

## 2017-08-12 ENCOUNTER — Telehealth: Payer: Self-pay | Admitting: Neurology

## 2017-08-12 NOTE — Telephone Encounter (Signed)
lvm for pt to call back about [facility] mri  [employer] Auth: [employer] via [employer] website

## 2017-08-12 NOTE — Telephone Encounter (Signed)
Pt has returned the call to Irving Burtonmily, she is asking for a call back on her work# [phone]

## 2017-08-12 NOTE — Telephone Encounter (Signed)
lvm for pt to call back about scheduling mri  °

## 2017-08-13 NOTE — Telephone Encounter (Signed)
Spoke to patient she is scheduled for 08/26/17 at [facility].  °

## 2017-08-13 NOTE — Telephone Encounter (Signed)
[facility] to patient [facility]

## 2017-08-26 ENCOUNTER — Ambulatory Visit (INDEPENDENT_AMBULATORY_CARE_PROVIDER_SITE_OTHER): Payer: 59

## 2017-08-26 DIAGNOSIS — M5416 Radiculopathy, lumbar region: Secondary | ICD-10-CM | POA: Diagnosis not present

## 2017-08-26 DIAGNOSIS — M25552 Pain in left hip: Secondary | ICD-10-CM

## 2017-08-28 ENCOUNTER — Telehealth: Payer: Self-pay | Admitting: Neurology

## 2017-08-28 NOTE — Telephone Encounter (Signed)
Please call patient, repeat MRI of lumbar showed L4-5 leftward disc herniation, bilateral small joint hypertrophy, there is probable left L4 and nerve root compression due to left side foraminal narrowing,  I will review films with her at next follow-up visit on September 18, 2017,  IMPRESSION: This MRI of the lumbar spine without contrast shows the following:  1.   At L4-L5, there is a leftward disc herniation and bilateral facet hypertrophy.  There is severe lateral recess stenosis leading to probable left L5 nerve root compression.  There is also moderate foraminal narrowing with some encroachment upon the left L4 nerve root but less likely to lead to compression there. 2.   There are milder degenerative changes at the other lumbar levels without significant potential for nerve root compression.

## 2017-08-28 NOTE — Telephone Encounter (Signed)
Spoke with pt. and reviewed below MRI result, advised Dr. Terrace ArabiaYan will review in greater detail at pending 7/12 appt.   She verbalized understanding of same/fim

## 2017-09-18 ENCOUNTER — Ambulatory Visit (INDEPENDENT_AMBULATORY_CARE_PROVIDER_SITE_OTHER): Payer: 59 | Admitting: Neurology

## 2017-09-18 ENCOUNTER — Encounter (INDEPENDENT_AMBULATORY_CARE_PROVIDER_SITE_OTHER): Payer: 59 | Admitting: Neurology

## 2017-09-18 DIAGNOSIS — M5416 Radiculopathy, lumbar region: Secondary | ICD-10-CM

## 2017-09-18 DIAGNOSIS — Z0289 Encounter for other administrative examinations: Secondary | ICD-10-CM

## 2017-09-18 DIAGNOSIS — M25552 Pain in left hip: Secondary | ICD-10-CM

## 2017-09-18 NOTE — Patient Instructions (Signed)
[facility]  Address [address]  [address] [place], [place] [address] Phone: [phone]
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
## 2017-09-18 NOTE — Procedures (Signed)
        Full Name: [name] [name] Gender: Female MRN #: [id number] Date of Birth: 08-Jul-2050    Visit Date: 09/18/17 12:10 Age: [age] Years 9 Months Old Examining Physician: Levert FeinsteinYijun Liya Strollo, MD  Referring Physician: Terrace ArabiaYan, MD History: 67 year old female, with recurrent history of radiating pain to left lower extremity,  Summary of the tests:  Nerve conduction study: Left sural, superficial peroneal sensory responses are normal.  Left peroneal to EDB and tibial motor responses were normal.  Left tibial H reflexe was present.   Electromyography: Selective needle examination of left lower extremity showed chronic neuropathic changes involving left tibialis anterior, peroneal longus, tibialis posterior, medial gastrocnemius, vastus lateralis, there is also evidence of active denervation, polyphasic smaller amplitude paraspinal motor unit potentials.  Conclusion: This is an abnormal study.  There is electrodiagnostic evidence of active left L4, L5 lumbar radiculopathy.   ------------------------------- Levert FeinsteinYIjun Honestie Kulik, M.D.  [facility] [address] [place], [place] [address] Tel: [phone] Fax: [phone]        [facility]    Nerve / Sites Muscle Latency Ref. Amplitude Ref. Rel Amp Segments Distance Velocity Ref. Area    ms ms mV mV %  cm m/s m/s mVms  L Peroneal - EDB     Ankle EDB 5.1 ?6.5 2.4 ?2.0 100 Ankle - EDB 9   7.1     Fib head EDB 11.4  2.2  92.5 Fib head - Ankle 30 48 ?44 6.4     Pop fossa EDB 13.3  2.4  109 Pop fossa - Fib head 10 52 ?44 7.0         Pop fossa - Ankle      L Tibial - AH     Ankle AH 4.8 ?5.8 5.4 ?4.0 100 Ankle - AH 9   10.1     Pop fossa AH 13.7  2.1  38.8 Pop fossa - Ankle 38 43 ?41 5.7         SNC    Nerve / Sites Rec. Site Peak Lat Ref.  Amp Ref. Segments Distance    ms ms V V  cm  L Sural - Ankle (Calf)     Calf Ankle 3.6 ?4.4 9 ?6 Calf - Ankle 14  L Superficial peroneal - Ankle     Lat leg Ankle 3.0 ?4.4 6 ?6 Lat leg - Ankle 14      F  Wave    Nerve F Lat Ref.   ms ms  L Tibial - AH 55.7 ?56.0       EMG full       EMG Summary Table    Spontaneous MUAP Recruitment  Muscle IA Fib PSW Fasc Other Amp Dur. Poly Pattern  L. Tibialis anterior Increased 1+ None None _______ Increased Increased Normal Reduced  L. Tibialis posterior Increased None None None _______ Normal Normal Normal Reduced  L. Peroneus longus Increased None None None _______ Increased Increased Normal Reduced  L. Gastrocnemius (Medial head) Normal None None None _______ Normal Normal Normal Reduced  L. Vastus lateralis Normal None None None _______ Normal Normal Normal Normal  L. Biceps femoris (short head) Normal None None None _______ Normal Normal Normal Normal  L. Gluteus medius Normal None None None _______ Increased Normal Normal Reduced  L. Lumbar paraspinals (mid) Increased 1+ None None _______ Normal Normal Normal Normal  L. Lumbar paraspinals (low) Increased 1+ None None _______ Normal Normal Normal Normal

## 2017-10-09 ENCOUNTER — Ambulatory Visit: Payer: 59 | Admitting: Diagnostic Neuroimaging

## 2017-10-29 ENCOUNTER — Other Ambulatory Visit: Payer: Self-pay | Admitting: Neurosurgery

## 2018-01-01 ENCOUNTER — Other Ambulatory Visit: Payer: Self-pay

## 2018-01-01 ENCOUNTER — Encounter (HOSPITAL_COMMUNITY)
Admission: RE | Admit: 2018-01-01 | Discharge: 2018-01-01 | Disposition: A | Discharge status: Home / Self Care | Payer: 59 | Source: Ambulatory Visit | Attending: Neurosurgery | Admitting: Neurosurgery

## 2018-01-01 ENCOUNTER — Encounter (HOSPITAL_COMMUNITY): Payer: Self-pay

## 2018-01-01 DIAGNOSIS — Z01818 Encounter for other preprocedural examination: Secondary | ICD-10-CM | POA: Insufficient documentation

## 2018-01-01 DIAGNOSIS — I440 Atrioventricular block, first degree: Secondary | ICD-10-CM | POA: Diagnosis not present

## 2018-01-01 HISTORY — DX: Other complications of anesthesia, initial encounter: T88.59XA

## 2018-01-01 HISTORY — DX: Adverse effect of unspecified anesthetic, initial encounter: T41.45XA

## 2018-01-01 LAB — BASIC METABOLIC PANEL
Anion gap: 5 (ref 5–15)
BUN: 20 mg/dL (ref 8–23)
CO2: 30 mmol/L (ref 22–32)
Calcium: 9 mg/dL (ref 8.9–10.3)
Chloride: 104 mmol/L (ref 98–111)
Creatinine, Ser: 0.64 mg/dL (ref 0.44–1.00)
GFR calc Af Amer: >60 mL/min (ref >60)
Glucose, Bld: 103 mg/dL — ABNORMAL HIGH (ref 70–99)
POTASSIUM: 3.4 mmol/L — AB (ref 3.5–5.1)
Sodium: 139 mmol/L (ref 135–145)

## 2018-01-01 LAB — TYPE AND SCREEN
ABO/RH(D): A POS
Antibody Screen: NEGATIVE

## 2018-01-01 LAB — ABO/RH: ABO/RH(D): A POS

## 2018-01-01 LAB — CBC
HCT: 39 % (ref 36.0–46.0)
HEMOGLOBIN: 11.9 g/dL — AB (ref 12.0–15.0)
MCH: 28.7 pg (ref 26.0–34.0)
MCHC: 30.5 g/dL (ref 30.0–36.0)
MCV: 94 fL (ref 80.0–100.0)
Platelets: 220 10*3/uL (ref 150–400)
RBC: 4.15 MIL/uL (ref 3.87–5.11)
RDW: 13.4 % (ref 11.5–15.5)
WBC: 5.8 10*3/uL (ref 4.0–10.5)
nRBC: 0 % (ref 0.0–0.2)

## 2018-01-01 LAB — SURGICAL PCR SCREEN
MRSA, PCR: NEGATIVE
STAPHYLOCOCCUS AUREUS: NEGATIVE

## 2018-01-01 NOTE — Progress Notes (Addendum)
PCP - Dr. Shaune Pollack  Cardiologist - Denies  Chest x-ray - Denies  EKG - 01/01/18  Stress Test - Denies  ECHO - 07/08/13 (E)  Cardiac Cath - Denies  AICD- n/a PM- n/a LOOP- n/a  Sleep Study - Denies CPAP - None  LABS- 01/01/18: CBC,BMP, T/S, PCR  ASA- Denies  Pt sts her bp increases when she goes to doctor's appointments. Pt sts she took her scheduled medicines this morning. Pt made aware that her surgery could be cancelled due to abnormal bp levels.  Anesthesia- Yes- EKG  Pt denies having chest pain, sob, or fever at this time. All instructions explained to the pt, with a verbal understanding of the material. Pt agrees to go over the instructions while at home for a better understanding. The opportunity to ask questions was provided.

## 2018-01-01 NOTE — Pre-Procedure Instructions (Signed)
[name] [name]  01/01/2018      [id number] [place], [place] - [phone] AT [facility] [address] [place] [address] Phone: [phone] Fax: [phone]    Your procedure is scheduled on Tues. Nov. 5, 2019 from 7:30AM-11:00AM  Report to [facility] Admitting Entrance "A" at 5:30AM  Call this number if you have problems the morning of surgery:  [phone]   Remember:  Do not eat or drink after midnight on Nov. 4th    Take these medicines the morning of surgery with A SIP OF WATER: Diltiazem (CARDIZEM CD), Gabapentin (NEURONTIN), and Metoprolol succinate (TOPROL-XL).  7 days before surgery (01/05/18), stop taking all Other Aspirin Products, Vitamins, Fish oils, and Herbal medications. Also stop all NSAIDS i.e. Advil, Ibuprofen, Motrin, Aleve, Anaprox, Naproxen, BC, Goody Powders, and all Supplements.    Do not wear jewelry, make-up or nail polish.  Do not wear lotions, powders, or perfumes, or deodorant.  Do not shave 48 hours prior to surgery.   Do not bring valuables to the hospital.  [facility] is not responsible for any belongings or valuables.  Contacts, dentures or bridgework may not be worn into surgery.  Leave your suitcase in the car.  After surgery it may be brought to your room.  For patients admitted to the hospital, discharge time will be determined by your treatment team.  Patients discharged the day of surgery will not be allowed to drive home.   Special instructions:  [place]- Preparing For Surgery  Before surgery, you can play an important role. Because skin is not sterile, your skin needs to be as free of germs as possible. You can reduce the number of germs on your skin by washing with CHG (chlorahexidine gluconate) Soap before surgery.  CHG is an antiseptic cleaner which kills germs and bonds with the skin to continue killing germs even after washing.    Oral Hygiene is also  important to reduce your risk of infection.  Remember - BRUSH YOUR TEETH THE MORNING OF SURGERY WITH YOUR REGULAR TOOTHPASTE  Please do not use if you have an allergy to CHG or antibacterial soaps. If your skin becomes reddened/irritated stop using the CHG.  Do not shave (including legs and underarms) for at least 48 hours prior to first CHG shower. It is OK to shave your face.  Please follow these instructions carefully.   1. Shower the NIGHT BEFORE SURGERY and the MORNING OF SURGERY with CHG.   2. If you chose to wash your hair, wash your hair first as usual with your normal shampoo.  3. After you shampoo, rinse your hair and body thoroughly to remove the shampoo.  4. Use CHG as you would any other liquid soap. You can apply CHG directly to the skin and wash gently with a scrungie or a clean washcloth.   5. Apply the CHG Soap to your body ONLY FROM THE NECK DOWN.  Do not use on open wounds or open sores. Avoid contact with your eyes, ears, mouth and genitals (private parts). Wash Face and genitals (private parts)  with your normal soap.  6. Wash thoroughly, paying special attention to the area where your surgery will be performed.  7. Thoroughly rinse your body with warm water from the neck down.  8. DO NOT shower/wash with your normal soap after using and rinsing off the CHG Soap.  9. Pat yourself dry with a CLEAN TOWEL.  10. Wear CLEAN PAJAMAS to bed the night before surgery, wear comfortable clothes the morning of surgery  11. Place CLEAN SHEETS on your bed the night of your first shower and DO NOT SLEEP WITH PETS.  Day of Surgery:  Do not apply any deodorants/lotions.  Please wear clean clothes to the hospital/surgery center.   Remember to brush your teeth WITH YOUR REGULAR TOOTHPASTE.  Please read over the following fact sheets that you were given. Pain Booklet, Coughing and Deep Breathing, MRSA Information and Surgical Site Infection Prevention

## 2018-01-11 ENCOUNTER — Other Ambulatory Visit: Payer: Self-pay | Admitting: Neurosurgery

## 2018-01-11 NOTE — H&P (Signed)
Chief Complaint   Back pain  HPI   HPI: [name] [name] is a 67 y.o. female with history of chronic low back pain with radiation to her left posterolateral leg to her foot.  It is associated with numbness and tingling the same distribution. She denies bowel/bladder dysfunction. She had previously undergone 2 microdiskectomies but unfortunately pain has returned.  A repeat MRI of her lumbar spine was significant for recurrent disc herniation at L4-5.  She presents today for surgery. She is without any concerns.  Patient Active Problem List   Diagnosis Date Noted  . Left lumbar radiculopathy 08/10/2017  . Left hip pain 08/10/2017  . Left leg pain 08/07/2017  . Scoliosis deformity of spine 03/31/2017  . Lumbar post-laminectomy syndrome 03/17/2017  . Degeneration of lumbar intervertebral disc 10/31/2016  . URI (upper respiratory infection) 07/24/2011    PMH: Past Medical History:  Diagnosis Date  . Abdominal pain   . Arthritis   . Complication of anesthesia    Hard to wake up.  Marland Kitchen Heart murmur   . Hypercholesteremia   . Hypertension   . Left leg DVT (HCC) 06/2008   s/p knee surgery  . Left leg pain   . Pneumonia   . Prediabetes     PSH: Past Surgical History:  Procedure Laterality Date  . BACK SURGERY     Revision of microlumbardecompression 04-16-17 Dr. Shelle Iron  . CHOLECYSTECTOMY  12/24/10  . KNEE SURGERY  2010   left mcl   . LUMBAR LAMINECTOMY/DECOMPRESSION MICRODISCECTOMY Left 10/31/2016   Procedure: Microlumbar decompression L4-5 left ;  Surgeon: Jene Every, MD;  Location: [facility];  Service: Orthopedics;  Laterality: Left;  90 mins  . LUMBAR LAMINECTOMY/DECOMPRESSION MICRODISCECTOMY N/A 04/16/2017   Procedure: Revision microlumbar decompression L4-5;  Surgeon: Jene Every, MD;  Location: [facility];  Service: Orthopedics;  Laterality: N/A;  2 hrs  . TUBAL LIGATION      No medications prior to admission.    SH: Social History   Tobacco Use  . Smoking status:  Never Smoker  . Smokeless tobacco: Never Used  Substance Use Topics  . Alcohol use: No  . Drug use: No    MEDS: Prior to Admission medications   Medication Sig Start Date End Date Taking? Authorizing Provider  diltiazem (CARDIZEM CD) 240 MG 24 hr capsule Take 240 mg by mouth daily.     Yes [provider]  docusate sodium (COLACE) 100 MG capsule Take 1 capsule (100 mg total) by mouth 2 (two) times daily as needed for mild constipation. Patient taking differently: Take 200 mg by mouth at bedtime as needed (constipation).  10/31/16  Yes Jene Every, MD  furosemide (LASIX) 20 MG tablet Take 20 mg by mouth daily as needed for fluid.   Yes [provider]  gabapentin (NEURONTIN) 300 MG capsule Take 600 mg by mouth 3 (three) times daily.    Yes [provider]  hydrochlorothiazide (HYDRODIURIL) 25 MG tablet Take 25 mg by mouth daily.   Yes [provider]  ibuprofen (ADVIL,MOTRIN) 200 MG tablet Take 3 tablets (600 mg total) by mouth at bedtime as needed for moderate pain. Resume 5 days post-op as needed Patient taking differently: Take 400-600 mg by mouth every 8 (eight) hours as needed for moderate pain. Resume 5 days post-op as needed 04/17/17  Yes Andrez Grime M, PA-C  metoprolol succinate (TOPROL-XL) 100 MG 24 hr tablet Take 150 mg by mouth daily. Take with or immediately following a meal.  Yes [provider]  olmesartan (BENICAR) 20 MG tablet Take 20 mg by mouth daily.   Yes [provider]  potassium chloride SA (K-DUR,KLOR-CON) 20 MEQ tablet Take 20 mEq by mouth daily.   Yes [provider]  meloxicam (MOBIC) 15 MG tablet Take 1 tablet (15 mg total) by mouth daily. Patient not taking: Reported on 12/24/2017 08/10/17   Levert Feinstein, MD    ALLERGY: No Known Allergies  Social History   Tobacco Use  . Smoking status: Never Smoker  . Smokeless tobacco: Never Used  Substance Use Topics  . Alcohol use: No     Family  History  Problem Relation Age of Onset  . Heart disease Mother   . Heart disease Father      ROS   ROS  Exam   There were no vitals filed for this visit. General appearance: WDWN, NAD Eyes: No scleral injection Cardiovascular: Regular rate and rhythm without murmurs, rubs, gallops. No edema or variciosities. Distal pulses normal. Pulmonary: Effort normal, non-labored breathing Musculoskeletal:     Muscle tone upper extremities: Normal    Muscle tone lower extremities: Normal    Motor exam: Upper Extremities Deltoid Bicep Tricep Grip  Right 5/5 5/5 5/5 5/5  Left 5/5 5/5 5/5 5/5   Lower Extremity IP Quad PF DF EHL  Right 5/5 5/5 5/5 5/5 5/5  Left 5/5 5/5 5/5 5/5 5/5   Neurological Mental Status:    - Patient is awake, alert, oriented to person, place, month, year, and situation    - Patient is able to give a clear and coherent history.    - No signs of aphasia or neglect Cranial Nerves    - II: Visual Fields are full. PERRL    - III/IV/VI: EOMI without ptosis or diploplia.     - V: Facial sensation is grossly normal    - VII: Facial movement is symmetric.     - VIII: hearing is intact to voice    - X: Uvula elevates symmetrically    - XI: Shoulder shrug is symmetric.    - XII: tongue is midline without atrophy or fasciculations.  Sensory: Sensation grossly intact to LT Deep Tendon Reflexes    - 2+ and symmetric in the biceps and patellae.  Plantars   - Toes are downgoing bilaterally.  Cerebellar    - FNF and HKS are intact bilaterally   Results - Imaging/Labs   No results found for this or any previous visit (from the past 48 hour(s)).  No results found.  IMAGING: MRI of the lumbar spine dated 08/26/2017 was again reviewed.  This demonstrates maintenance of normal lumbar lordosis.  Primary finding is seen at L4-5 where there is evidence of prior interlaminar decompression.  There is a left eccentric disc herniation with significant lateral recess stenosis and  likely compression of the traversing left L5 nerve root.  Impression/Plan   67 y.o. female with multiple recurrent left-sided L4-5 disc herniation and 2 prior microdiscectomy procedures with continued left-sided back and leg pain.  She has attempted multiple previous reasonable conservative therapies. We will proceed with  proceed with L4-5 decompression and fusion.   While in the office the risks which include but are not limited to nerve root injury leading to leg or foot weakness/numbness and/or bowel and bladder dysfunction, CSF leak, bleeding, and infection.  Possible outcomes of surgery were also discussed including the possibility of persistence or worsening of pain symptoms and the possibility of accelerated adjacent  level degeneration. The general risks of anesthesia were also reviewed including heart attack, stroke, and DVT/PE.    The patient understood our discussion, all her questions were answered.  We will proceed with surgery.

## 2018-01-12 ENCOUNTER — Inpatient Hospital Stay (HOSPITAL_COMMUNITY): Payer: 59 | Admitting: Certified Registered Nurse Anesthetist

## 2018-01-12 ENCOUNTER — Encounter (HOSPITAL_COMMUNITY)
Admission: RE | Disposition: A | Discharge status: Home / Self Care | Payer: Self-pay | Source: Ambulatory Visit | Attending: Neurosurgery

## 2018-01-12 ENCOUNTER — Other Ambulatory Visit: Payer: Self-pay

## 2018-01-12 ENCOUNTER — Inpatient Hospital Stay (HOSPITAL_COMMUNITY): Payer: 59 | Admitting: Physician Assistant

## 2018-01-12 ENCOUNTER — Inpatient Hospital Stay (HOSPITAL_COMMUNITY): Payer: 59

## 2018-01-12 ENCOUNTER — Inpatient Hospital Stay (HOSPITAL_COMMUNITY)
Admission: RE | Admit: 2018-01-12 | Discharge: 2018-01-13 | DRG: 460 | Disposition: A | Discharge status: Home / Self Care | Payer: 59 | Attending: Neurosurgery | Admitting: Neurosurgery

## 2018-01-12 ENCOUNTER — Encounter (HOSPITAL_COMMUNITY): Payer: Self-pay | Admitting: *Deleted

## 2018-01-12 DIAGNOSIS — E7800 Pure hypercholesterolemia, unspecified: Secondary | ICD-10-CM | POA: Diagnosis present

## 2018-01-12 DIAGNOSIS — I10 Essential (primary) hypertension: Secondary | ICD-10-CM | POA: Diagnosis present

## 2018-01-12 DIAGNOSIS — M545 Low back pain: Secondary | ICD-10-CM | POA: Diagnosis present

## 2018-01-12 DIAGNOSIS — M5416 Radiculopathy, lumbar region: Secondary | ICD-10-CM | POA: Diagnosis present

## 2018-01-12 DIAGNOSIS — E669 Obesity, unspecified: Secondary | ICD-10-CM | POA: Diagnosis present

## 2018-01-12 DIAGNOSIS — M5116 Intervertebral disc disorders with radiculopathy, lumbar region: Secondary | ICD-10-CM | POA: Diagnosis present

## 2018-01-12 DIAGNOSIS — Z86718 Personal history of other venous thrombosis and embolism: Secondary | ICD-10-CM | POA: Diagnosis not present

## 2018-01-12 DIAGNOSIS — Z419 Encounter for procedure for purposes other than remedying health state, unspecified: Secondary | ICD-10-CM

## 2018-01-12 DIAGNOSIS — Z9049 Acquired absence of other specified parts of digestive tract: Secondary | ICD-10-CM | POA: Diagnosis not present

## 2018-01-12 SURGERY — POSTERIOR LUMBAR FUSION 1 LEVEL
Anesthesia: General | Site: Spine Lumbar

## 2018-01-12 MED ORDER — BUPIVACAINE HCL (PF) 0.5 % IJ SOLN
INTRAMUSCULAR | Status: AC
  Filled 2018-01-12: qty 30

## 2018-01-12 MED ORDER — METHOCARBAMOL 500 MG PO TABS
ORAL_TABLET | ORAL | Status: AC
  Administered 2018-01-12: 500 mg via ORAL
  Filled 2018-01-12: qty 1

## 2018-01-12 MED ORDER — SODIUM CHLORIDE 0.9 % IV SOLN: INTRAVENOUS | Status: DC

## 2018-01-12 MED ORDER — MIDAZOLAM HCL 2 MG/2ML IJ SOLN
INTRAMUSCULAR | Status: DC | PRN
  Administered 2018-01-12: 2 mg via INTRAVENOUS

## 2018-01-12 MED ORDER — SODIUM CHLORIDE 0.9 % IV SOLN
INTRAVENOUS | Status: DC | PRN
  Administered 2018-01-12: 25 ug/min via INTRAVENOUS

## 2018-01-12 MED ORDER — OXYCODONE HCL 5 MG PO TABS
5.0000 mg | ORAL_TABLET | ORAL | Status: DC | PRN
  Administered 2018-01-12 – 2018-01-13 (×3): 10 mg via ORAL
  Filled 2018-01-12 (×2): qty 2

## 2018-01-12 MED ORDER — 0.9 % SODIUM CHLORIDE (POUR BTL) OPTIME
TOPICAL | Status: DC | PRN
  Administered 2018-01-12: 1000 mL

## 2018-01-12 MED ORDER — FENTANYL CITRATE (PF) 250 MCG/5ML IJ SOLN
INTRAMUSCULAR | Status: AC
  Filled 2018-01-12: qty 5

## 2018-01-12 MED ORDER — METHOCARBAMOL 500 MG PO TABS
500.0000 mg | ORAL_TABLET | Freq: Four times a day (QID) | ORAL | Status: DC | PRN
  Administered 2018-01-12: 500 mg via ORAL

## 2018-01-12 MED ORDER — HYDRALAZINE HCL 20 MG/ML IJ SOLN
10.0000 mg | Freq: Once | INTRAMUSCULAR | Status: AC
  Administered 2018-01-12: 10 mg via INTRAVENOUS
  Filled 2018-01-12: qty 1
  Filled 2018-01-12: qty 0.5

## 2018-01-12 MED ORDER — CHLORHEXIDINE GLUCONATE CLOTH 2 % EX PADS: 6.0000 | MEDICATED_PAD | Freq: Once | CUTANEOUS | Status: DC

## 2018-01-12 MED ORDER — ACETAMINOPHEN 500 MG PO TABS
1000.0000 mg | ORAL_TABLET | Freq: Four times a day (QID) | ORAL | Status: DC
  Administered 2018-01-12 – 2018-01-13 (×3): 1000 mg via ORAL
  Filled 2018-01-12 (×3): qty 2

## 2018-01-12 MED ORDER — FENTANYL CITRATE (PF) 100 MCG/2ML IJ SOLN
25.0000 ug | INTRAMUSCULAR | Status: DC | PRN
  Administered 2018-01-12: 50 ug via INTRAVENOUS

## 2018-01-12 MED ORDER — ROCURONIUM BROMIDE 50 MG/5ML IV SOSY
PREFILLED_SYRINGE | INTRAVENOUS | Status: AC
  Filled 2018-01-12: qty 5

## 2018-01-12 MED ORDER — LIDOCAINE 2% (20 MG/ML) 5 ML SYRINGE
INTRAMUSCULAR | Status: AC
  Filled 2018-01-12: qty 5

## 2018-01-12 MED ORDER — CEFAZOLIN SODIUM-DEXTROSE 2-4 GM/100ML-% IV SOLN
2.0000 g | Freq: Three times a day (TID) | INTRAVENOUS | Status: AC
  Administered 2018-01-12 (×2): 2 g via INTRAVENOUS
  Filled 2018-01-12 (×2): qty 100

## 2018-01-12 MED ORDER — DEXAMETHASONE SODIUM PHOSPHATE 10 MG/ML IJ SOLN
INTRAMUSCULAR | Status: DC | PRN
  Administered 2018-01-12: 10 mg via INTRAVENOUS

## 2018-01-12 MED ORDER — THROMBIN 5000 UNITS EX SOLR
CUTANEOUS | Status: AC
  Filled 2018-01-12: qty 5000

## 2018-01-12 MED ORDER — SODIUM CHLORIDE 0.9% FLUSH: 3.0000 mL | INTRAVENOUS | Status: DC | PRN

## 2018-01-12 MED ORDER — PROPOFOL 10 MG/ML IV BOLUS
INTRAVENOUS | Status: DC | PRN
  Administered 2018-01-12: 100 mg via INTRAVENOUS

## 2018-01-12 MED ORDER — ONDANSETRON HCL 4 MG PO TABS: 4.0000 mg | ORAL_TABLET | Freq: Four times a day (QID) | ORAL | Status: DC | PRN

## 2018-01-12 MED ORDER — ONDANSETRON HCL 4 MG/2ML IJ SOLN: 4.0000 mg | Freq: Four times a day (QID) | INTRAMUSCULAR | Status: DC | PRN

## 2018-01-12 MED ORDER — EPHEDRINE SULFATE 50 MG/ML IJ SOLN
INTRAMUSCULAR | Status: DC | PRN
  Administered 2018-01-12: 5 mg via INTRAVENOUS

## 2018-01-12 MED ORDER — ONDANSETRON HCL 4 MG/2ML IJ SOLN
INTRAMUSCULAR | Status: DC | PRN
  Administered 2018-01-12: 4 mg via INTRAVENOUS

## 2018-01-12 MED ORDER — ONDANSETRON HCL 4 MG/2ML IJ SOLN: 4.0000 mg | Freq: Once | INTRAMUSCULAR | Status: DC | PRN

## 2018-01-12 MED ORDER — GLYCOPYRROLATE 0.2 MG/ML IJ SOLN
INTRAMUSCULAR | Status: DC | PRN
  Administered 2018-01-12: 0.1 mg via INTRAVENOUS

## 2018-01-12 MED ORDER — BUPIVACAINE HCL (PF) 0.5 % IJ SOLN
INTRAMUSCULAR | Status: DC | PRN
  Administered 2018-01-12: 5 mL

## 2018-01-12 MED ORDER — SENNA 8.6 MG PO TABS
1.0000 | ORAL_TABLET | Freq: Two times a day (BID) | ORAL | Status: DC
  Administered 2018-01-12: 8.6 mg via ORAL
  Filled 2018-01-12: qty 1

## 2018-01-12 MED ORDER — LABETALOL HCL 5 MG/ML IV SOLN
10.0000 mg | Freq: Once | INTRAVENOUS | Status: AC
  Administered 2018-01-12: 10 mg via INTRAVENOUS
  Filled 2018-01-12: qty 40

## 2018-01-12 MED ORDER — ALBUMIN HUMAN 5 % IV SOLN
INTRAVENOUS | Status: DC | PRN
  Administered 2018-01-12: 11:00:00 via INTRAVENOUS

## 2018-01-12 MED ORDER — GLYCOPYRROLATE PF 0.2 MG/ML IJ SOSY
PREFILLED_SYRINGE | INTRAMUSCULAR | Status: AC
  Filled 2018-01-12: qty 1

## 2018-01-12 MED ORDER — LIDOCAINE 2% (20 MG/ML) 5 ML SYRINGE
INTRAMUSCULAR | Status: DC | PRN
  Administered 2018-01-12: 80 mg via INTRAVENOUS

## 2018-01-12 MED ORDER — GABAPENTIN 300 MG PO CAPS
300.0000 mg | ORAL_CAPSULE | Freq: Three times a day (TID) | ORAL | Status: DC
  Administered 2018-01-12 (×2): 300 mg via ORAL
  Filled 2018-01-12 (×2): qty 1

## 2018-01-12 MED ORDER — LABETALOL HCL 5 MG/ML IV SOLN
INTRAVENOUS | Status: AC
  Filled 2018-01-12: qty 4

## 2018-01-12 MED ORDER — DOCUSATE SODIUM 100 MG PO CAPS
100.0000 mg | ORAL_CAPSULE | Freq: Two times a day (BID) | ORAL | Status: DC
  Administered 2018-01-12: 100 mg via ORAL
  Filled 2018-01-12: qty 1

## 2018-01-12 MED ORDER — OXYCODONE HCL 5 MG PO TABS
ORAL_TABLET | ORAL | Status: AC
  Administered 2018-01-12: 10 mg via ORAL
  Filled 2018-01-12: qty 2

## 2018-01-12 MED ORDER — SODIUM CHLORIDE 0.9 % IV SOLN: 250.0000 mL | INTRAVENOUS | Status: DC

## 2018-01-12 MED ORDER — LIDOCAINE-EPINEPHRINE 1 %-1:100000 IJ SOLN
INTRAMUSCULAR | Status: AC
  Filled 2018-01-12: qty 1

## 2018-01-12 MED ORDER — LACTATED RINGERS IV SOLN
INTRAVENOUS | Status: DC | PRN
  Administered 2018-01-12 (×2): via INTRAVENOUS

## 2018-01-12 MED ORDER — HYDROMORPHONE HCL 1 MG/ML IJ SOLN
0.5000 mg | INTRAMUSCULAR | Status: DC | PRN
  Administered 2018-01-12: 1 mg via INTRAVENOUS
  Filled 2018-01-12: qty 1

## 2018-01-12 MED ORDER — ROCURONIUM BROMIDE 50 MG/5ML IV SOSY
PREFILLED_SYRINGE | INTRAVENOUS | Status: DC | PRN
  Administered 2018-01-12 (×2): 20 mg via INTRAVENOUS
  Administered 2018-01-12: 40 mg via INTRAVENOUS
  Administered 2018-01-12: 10 mg via INTRAVENOUS

## 2018-01-12 MED ORDER — SODIUM CHLORIDE 0.9% FLUSH
3.0000 mL | Freq: Two times a day (BID) | INTRAVENOUS | Status: DC
  Administered 2018-01-12: 3 mL via INTRAVENOUS

## 2018-01-12 MED ORDER — THROMBIN 5000 UNITS EX SOLR
OROMUCOSAL | Status: DC | PRN
  Administered 2018-01-12: 5 mL via TOPICAL

## 2018-01-12 MED ORDER — BISACODYL 10 MG RE SUPP: 10.0000 mg | Freq: Every day | RECTAL | Status: DC | PRN

## 2018-01-12 MED ORDER — ACETAMINOPHEN 325 MG PO TABS: 650.0000 mg | ORAL_TABLET | ORAL | Status: DC | PRN

## 2018-01-12 MED ORDER — MENTHOL 3 MG MT LOZG: 1.0000 | LOZENGE | OROMUCOSAL | Status: DC | PRN

## 2018-01-12 MED ORDER — FENTANYL CITRATE (PF) 250 MCG/5ML IJ SOLN
INTRAMUSCULAR | Status: DC | PRN
  Administered 2018-01-12: 25 ug via INTRAVENOUS
  Administered 2018-01-12: 75 ug via INTRAVENOUS
  Administered 2018-01-12: 100 ug via INTRAVENOUS
  Administered 2018-01-12: 50 ug via INTRAVENOUS

## 2018-01-12 MED ORDER — HYDROCODONE-ACETAMINOPHEN 5-325 MG PO TABS: 1.0000 | ORAL_TABLET | ORAL | Status: DC | PRN

## 2018-01-12 MED ORDER — MIDAZOLAM HCL 2 MG/2ML IJ SOLN
INTRAMUSCULAR | Status: AC
  Filled 2018-01-12: qty 2

## 2018-01-12 MED ORDER — LIDOCAINE-EPINEPHRINE 1 %-1:100000 IJ SOLN
INTRAMUSCULAR | Status: DC | PRN
  Administered 2018-01-12: 5 mL

## 2018-01-12 MED ORDER — PHENOL 1.4 % MT LIQD: 1.0000 | OROMUCOSAL | Status: DC | PRN

## 2018-01-12 MED ORDER — SENNOSIDES-DOCUSATE SODIUM 8.6-50 MG PO TABS: 1.0000 | ORAL_TABLET | Freq: Every evening | ORAL | Status: DC | PRN

## 2018-01-12 MED ORDER — HYDRALAZINE HCL 20 MG/ML IJ SOLN
10.0000 mg | Freq: Once | INTRAMUSCULAR | Status: AC
  Administered 2018-01-12: 10 mg via INTRAVENOUS
  Filled 2018-01-12: qty 0.5

## 2018-01-12 MED ORDER — CEFAZOLIN SODIUM-DEXTROSE 2-4 GM/100ML-% IV SOLN
2.0000 g | INTRAVENOUS | Status: AC
  Administered 2018-01-12: 2 g via INTRAVENOUS
  Filled 2018-01-12: qty 100

## 2018-01-12 MED ORDER — FENTANYL CITRATE (PF) 100 MCG/2ML IJ SOLN
INTRAMUSCULAR | Status: AC
  Filled 2018-01-12: qty 2

## 2018-01-12 MED ORDER — HYDRALAZINE HCL 20 MG/ML IJ SOLN
INTRAMUSCULAR | Status: AC
  Filled 2018-01-12: qty 1

## 2018-01-12 MED ORDER — SODIUM CHLORIDE 0.9 % IV SOLN
INTRAVENOUS | Status: DC | PRN
  Administered 2018-01-12: 500 mL

## 2018-01-12 MED ORDER — ACETAMINOPHEN 650 MG RE SUPP: 650.0000 mg | RECTAL | Status: DC | PRN

## 2018-01-12 MED ORDER — SUGAMMADEX SODIUM 200 MG/2ML IV SOLN
INTRAVENOUS | Status: DC | PRN
  Administered 2018-01-12: 180.4 mg via INTRAVENOUS

## 2018-01-12 MED ORDER — ONDANSETRON HCL 4 MG/2ML IJ SOLN
INTRAMUSCULAR | Status: AC
  Filled 2018-01-12: qty 2

## 2018-01-12 MED ORDER — DEXAMETHASONE SODIUM PHOSPHATE 10 MG/ML IJ SOLN
INTRAMUSCULAR | Status: AC
  Filled 2018-01-12: qty 1

## 2018-01-12 MED ORDER — METHOCARBAMOL 1000 MG/10ML IJ SOLN
500.0000 mg | Freq: Four times a day (QID) | INTRAVENOUS | Status: DC | PRN
  Filled 2018-01-12: qty 5

## 2018-01-12 MED ORDER — EPHEDRINE 5 MG/ML INJ
INTRAVENOUS | Status: AC
  Filled 2018-01-12: qty 10

## 2018-01-12 MED ORDER — FLEET ENEMA 7-19 GM/118ML RE ENEM: 1.0000 | ENEMA | Freq: Once | RECTAL | Status: DC | PRN

## 2018-01-12 MED ORDER — PROPOFOL 10 MG/ML IV BOLUS
INTRAVENOUS | Status: AC
  Filled 2018-01-12: qty 40

## 2018-01-12 SURGICAL SUPPLY — 78 items
BAG DECANTER FOR FLEXI CONT (MISCELLANEOUS) ×3 IMPLANT
BASKET BONE COLLECTION (BASKET) ×3 IMPLANT
BENZOIN TINCTURE PRP APPL 2/3 (GAUZE/BANDAGES/DRESSINGS) IMPLANT
BIT DRILL 3.5 POWEREASE (BIT) ×2 IMPLANT
BIT DRILL 3.5MM POWEREASE (BIT) ×1
BLADE CLIPPER SURG (BLADE) IMPLANT
BLADE SURG 11 STRL SS (BLADE) ×3 IMPLANT
BUR MATCHSTICK NEURO 3.0 LAGG (BURR) ×3 IMPLANT
BUR PRECISION FLUTE 5.0 (BURR) ×3 IMPLANT
CANISTER SUCT 3000ML PPV (MISCELLANEOUS) ×3 IMPLANT
CARTRIDGE OIL MAESTRO DRILL (MISCELLANEOUS) ×1 IMPLANT
CLOSURE WOUND 1/2 X4 (GAUZE/BANDAGES/DRESSINGS)
CONT SPEC 4OZ CLIKSEAL STRL BL (MISCELLANEOUS) ×3 IMPLANT
COVER BACK TABLE 60X90IN (DRAPES) ×3 IMPLANT
COVER WAND RF STERILE (DRAPES) ×3 IMPLANT
DECANTER SPIKE VIAL GLASS SM (MISCELLANEOUS) ×3 IMPLANT
DERMABOND ADVANCED (GAUZE/BANDAGES/DRESSINGS) ×2
DERMABOND ADVANCED .7 DNX12 (GAUZE/BANDAGES/DRESSINGS) ×1 IMPLANT
DEVICE INTERBODY ELEVATE 23X8 (Cage) ×4 IMPLANT
DIFFUSER DRILL AIR PNEUMATIC (MISCELLANEOUS) ×3 IMPLANT
DRAPE C-ARM 42X72 X-RAY (DRAPES) ×3 IMPLANT
DRAPE C-ARMOR (DRAPES) ×3 IMPLANT
DRAPE LAPAROTOMY 100X72X124 (DRAPES) ×3 IMPLANT
DRAPE SURG 17X23 STRL (DRAPES) ×3 IMPLANT
DRSG OPSITE POSTOP 4X6 (GAUZE/BANDAGES/DRESSINGS) ×3 IMPLANT
DURAPREP 26ML APPLICATOR (WOUND CARE) ×3 IMPLANT
ELECT REM PT RETURN 9FT ADLT (ELECTROSURGICAL) ×3
ELECTRODE REM PT RTRN 9FT ADLT (ELECTROSURGICAL) ×1 IMPLANT
GAUZE 4X4 16PLY RFD (DISPOSABLE) IMPLANT
GAUZE SPONGE 4X4 12PLY STRL (GAUZE/BANDAGES/DRESSINGS) IMPLANT
GLOVE BIO SURGEON STRL SZ7.5 (GLOVE) ×6 IMPLANT
GLOVE BIOGEL PI IND STRL 6.5 (GLOVE) ×1 IMPLANT
GLOVE BIOGEL PI IND STRL 7.5 (GLOVE) ×4 IMPLANT
GLOVE BIOGEL PI INDICATOR 6.5 (GLOVE) ×2
GLOVE BIOGEL PI INDICATOR 7.5 (GLOVE) ×8
GLOVE ECLIPSE 7.0 STRL STRAW (GLOVE) ×6 IMPLANT
GLOVE EXAM NITRILE XL STR (GLOVE) IMPLANT
GLOVE SURG SS PI 6.0 STRL IVOR (GLOVE) ×3 IMPLANT
GLOVE SURG SS PI 7.5 STRL IVOR (GLOVE) ×18 IMPLANT
GOWN STRL REUS W/ TWL LRG LVL3 (GOWN DISPOSABLE) ×5 IMPLANT
GOWN STRL REUS W/ TWL XL LVL3 (GOWN DISPOSABLE) IMPLANT
GOWN STRL REUS W/TWL 2XL LVL3 (GOWN DISPOSABLE) IMPLANT
GOWN STRL REUS W/TWL LRG LVL3 (GOWN DISPOSABLE) ×10
GOWN STRL REUS W/TWL XL LVL3 (GOWN DISPOSABLE)
HEMOSTAT POWDER KIT SURGIFOAM (HEMOSTASIS) ×3 IMPLANT
KIT BASIN OR (CUSTOM PROCEDURE TRAY) ×3 IMPLANT
KIT INFUSE XX SMALL 0.7CC (Orthopedic Implant) ×3 IMPLANT
KIT POSITION SURG JACKSON T1 (MISCELLANEOUS) ×3 IMPLANT
KIT TURNOVER KIT B (KITS) ×3 IMPLANT
MILL MEDIUM DISP (BLADE) ×3 IMPLANT
NEEDLE HYPO 18GX1.5 BLUNT FILL (NEEDLE) IMPLANT
NEEDLE HYPO 22GX1.5 SAFETY (NEEDLE) ×3 IMPLANT
NEEDLE SPNL 18GX3.5 QUINCKE PK (NEEDLE) ×3 IMPLANT
NS IRRIG 1000ML POUR BTL (IV SOLUTION) ×3 IMPLANT
OIL CARTRIDGE MAESTRO DRILL (MISCELLANEOUS) ×3
PACK LAMINECTOMY NEURO (CUSTOM PROCEDURE TRAY) ×3 IMPLANT
PAD ARMBOARD 7.5X6 YLW CONV (MISCELLANEOUS) ×9 IMPLANT
PASTE BONE GRAFTON 5CC (Bone Implant) ×3 IMPLANT
PENCIL BUTTON HOLSTER BLD 10FT (ELECTRODE) ×3 IMPLANT
ROD COBALT 47.5X35 (Rod) ×6 IMPLANT
SCREW 5.5X30MM (Screw) ×6 IMPLANT
SCREW 5.5X35MM (Screw) ×4 IMPLANT
SCREW BN 35X5.5XMA NS SPNE (Screw) ×2 IMPLANT
SCREW SET SOLERA (Screw) ×8 IMPLANT
SCREW SET SOLERA TI (Screw) ×4 IMPLANT
SPACER SPNL XLORDOTIC 23X8X (Cage) ×2 IMPLANT
SPCR SPNL XLORDOTIC 23X8X (Cage) ×2 IMPLANT
SPONGE LAP 4X18 RFD (DISPOSABLE) IMPLANT
SPONGE SURGIFOAM ABS GEL 100 (HEMOSTASIS) IMPLANT
STRIP CLOSURE SKIN 1/2X4 (GAUZE/BANDAGES/DRESSINGS) IMPLANT
SUT VIC AB 0 CT1 18XCR BRD8 (SUTURE) ×2 IMPLANT
SUT VIC AB 0 CT1 8-18 (SUTURE) ×4
SUT VICRYL 3-0 RB1 18 ABS (SUTURE) ×6 IMPLANT
SYR 3ML LL SCALE MARK (SYRINGE) ×12 IMPLANT
TOWEL GREEN STERILE (TOWEL DISPOSABLE) ×3 IMPLANT
TOWEL GREEN STERILE FF (TOWEL DISPOSABLE) ×3 IMPLANT
TRAY FOLEY MTR SLVR 16FR STAT (SET/KITS/TRAYS/PACK) ×3 IMPLANT
WATER STERILE IRR 1000ML POUR (IV SOLUTION) ×3 IMPLANT

## 2018-01-12 NOTE — Transfer of Care (Signed)
Immediate Anesthesia Transfer of Care Note  Patient: [name] [name]  Procedure(s) Performed: POSTERIOR LUMBAR INTERBODY FUSION  LUMBAR FOUR- LUMBAR FIVE. (N/A Spine Lumbar)  Patient Location: PACU  Anesthesia Type:General  Level of Consciousness: drowsy and patient cooperative  Airway & Oxygen Therapy: Patient Spontanous Breathing and Patient connected to face mask oxygen  Post-op Assessment: Report given to RN, Post -op Vital signs reviewed and stable and Patient moving all extremities  Post vital signs: Reviewed and stable  Last Vitals:  Vitals Value Taken Time  BP    Temp    Pulse 67 01/12/2018 11:39 AM  Resp 28 01/12/2018 11:39 AM  SpO2 100 % 01/12/2018 11:39 AM  Vitals shown include unvalidated device data.  Last Pain:  Vitals:   01/12/18 0641  TempSrc:   PainSc: 0-No pain      Patients Stated Pain Goal: 2 (01/12/18 1610)  Complications: No apparent anesthesia complications

## 2018-01-12 NOTE — Anesthesia Postprocedure Evaluation (Signed)
Anesthesia Post Note  Patient: [name] [name]  Procedure(s) Performed: POSTERIOR LUMBAR INTERBODY FUSION  LUMBAR FOUR- LUMBAR FIVE. (N/A Spine Lumbar)     Patient location during evaluation: PACU Anesthesia Type: General Level of consciousness: awake and alert, awake and oriented Pain management: pain level controlled Vital Signs Assessment: post-procedure vital signs reviewed and stable Respiratory status: spontaneous breathing, nonlabored ventilation and respiratory function stable Cardiovascular status: blood pressure returned to baseline and stable Postop Assessment: no apparent nausea or vomiting Anesthetic complications: no    Last Vitals:  Vitals:   01/12/18 1320 01/12/18 1344  BP: (!) 153/80 (!) 160/82  Pulse: (!) 57 (!) 59  Resp: 17 16  Temp:  37.1 C  SpO2: 92% 96%    Last Pain:  Vitals:   01/12/18 1344  TempSrc: Oral  PainSc:                  [name]

## 2018-01-12 NOTE — Anesthesia Procedure Notes (Signed)
Procedure Name: Intubation Date/Time: 01/12/2018 8:03 AM Performed by: Modena Morrow, CRNA Pre-anesthesia Checklist: Patient identified, Emergency Drugs available, Suction available, Patient being monitored and Timeout performed Patient Re-evaluated:Patient Re-evaluated prior to induction Oxygen Delivery Method: Circle system utilized Preoxygenation: Pre-oxygenation with 100% oxygen Induction Type: IV induction Ventilation: Mask ventilation without difficulty Laryngoscope Size: Miller and 2 Grade View: Grade I Tube type: Oral Tube size: 7.0 mm Number of attempts: 1 Airway Equipment and Method: Patient positioned with wedge pillow and Stylet Placement Confirmation: ETT inserted through vocal cords under direct vision Secured at: 22 cm Tube secured with: Tape Dental Injury: Teeth and Oropharynx as per pre-operative assessment

## 2018-01-12 NOTE — Op Note (Signed)
NEUROSURGERY OPERATIVE NOTE   PREOP DIAGNOSIS:  1. Recurrent disc herniation, L4-5  POSTOP DIAGNOSIS: Same  PROCEDURE: 1. L4 laminectomy with facetectomy for decompression of exiting nerve roots, more than would be required for placement of interbody graft 2. Placement of anterior interbody device - [employer]tronic expandable 8mm lordotic cage X2 3. Posterior non-segmental instrumentation using cortical pedicle screws at L4 - L5 4. Interbody arthrodesis, L4-5 5. Use of locally harvested bone autograft 6. Use of non-structural bone allograft - BMP-2, Grafton  SURGEON: Dr. Lisbeth Renshaw, MD  ASSISTANT: Cindra Presume, PA-C  ANESTHESIA: General Endotracheal  EBL: 200cc  SPECIMENS: None  DRAINS: None  COMPLICATIONS: None immediate  CONDITION: Hemodynamically stable to PACU  HISTORY: [name] [name] is a 67 y.o. female who has been followed in the outpatient clinic with back and left leg pain related to disc herniation eccentric to the left at L4-5.  In the past, she has undergone microdiscectomy.  Unfortunately she suffered a recurrent disc herniation for which she underwent repeat microdiscectomy on the left at L4-5.  She then suffered a third recurrence, and at this point elected to proceed with surgical decompression and fusion.  The risks and benefits of the surgery were explained in detail to the patient and her family.  After all questions were answered informed consent was obtained and witnessed.  PROCEDURE IN DETAIL: The patient was brought to the operating room via stretcher. After induction of general anesthesia, the patient was positioned on the operative table in the prone position. All pressure points were meticulously padded. Previous skin Incision was then marked out and prepped and draped in the usual sterile fashion.  After timeout was conducted, skin was infiltrated with local anesthetic. Skin incision was then made sharply and Bovie electrocautery was used to  dissect the subcutaneous tissue until the lumbodorsal fascia was identified and incised. The muscle was then elevated in the subperiosteal plane and the L4 lamina and L4-5 facet complexes were identified. Self-retaining retractors were then placed.  Position was confirmed with intraoperative lateral fluoroscopy.  At this point attention was turned to decompression. Complete L4 laminectomy with complete bilateral facetectomy was completed using a combination of Kerrison rongeurs and a high-speed drill.  There was a significant amount of epidural fibrosis on the lateral edge of the thecal sac on the patient's left side extending ventrally over the disc space.  Using a combination of blunt dissectors, I was able to identify the recurrent disc herniation which was removed with pituitary rongeurs.  Disc space was then incised bilaterally, and using a combination of shavers, curettes and rongeurs, complete discectomy was completed. Endplates were prepared, and bone harvested during decompression was mixed with BMP and Grafton on and packed into the interspace totaling approximately 10 cc. A 8 mm lordotic expandable cage was tapped into place and expanded maximally.  Good position was confirmed with fluoroscopy.  At this point, the entry points for bilateral L4 and L5 cortical pedicle screws were identified with lateral fluoroscopy and anatomic landmarks.  These were drilled and tapped to 5.5 mm. Screws were then placed in L4 and L5. Rod was then placed, set screws placed and final tightened. Final AP and lateral fluoroscopic images confirmed good position.  The wound was then irrigated with copious amounts of antibiotic saline, then closed in standard fashion using a combination of interrupted 0 and 3-0 Vicryl stitches in the muscular, fascial, and subcutaneous layers. Skin was then closed using standard Dermabond. Sterile dressing was then applied. The patient was  then transferred to the stretcher, extubated, and  taken to the postanesthesia care unit in stable hemodynamic condition.  At the end of the case all sponge, needle, cottonoid, and instrument counts were correct.

## 2018-01-12 NOTE — Anesthesia Preprocedure Evaluation (Addendum)
Anesthesia Evaluation  Patient identified by MRN, date of birth, ID band Patient awake    Reviewed: Allergy & Precautions, NPO status , Patient's Chart, lab work & pertinent test results, reviewed documented beta blocker date and time   History of Anesthesia Complications (+) history of anesthetic complications (hard to wake up)  Airway Mallampati: II  TM Distance: >3 FB Neck ROM: Full    Dental  (+) Teeth Intact, Dental Advisory Given   Pulmonary neg pulmonary ROS,    Pulmonary exam normal breath sounds clear to auscultation       Cardiovascular hypertension, Pt. on medications and Pt. on home beta blockers (-) angina+ DVT  (-) CAD and (-) Past MI Normal cardiovascular exam Rhythm:Regular Rate:Normal     Neuro/Psych  Neuromuscular disease negative psych ROS   GI/Hepatic negative GI ROS, Neg liver ROS,   Endo/Other  Obesity   Renal/GU negative Renal ROS     Musculoskeletal  (+) Arthritis ,   Abdominal   Peds  Hematology  (+) Blood dyscrasia, anemia ,   Anesthesia Other Findings Day of surgery medications reviewed with the patient.  Reproductive/Obstetrics                            Anesthesia Physical Anesthesia Plan  ASA: III  Anesthesia Plan: General   Post-op Pain Management:    Induction: Intravenous  PONV Risk Score and Plan: 4 or greater and Midazolam, Dexamethasone, Ondansetron and Treatment may vary due to age or medical condition  Airway Management Planned: Oral ETT  Additional Equipment:   Intra-op Plan:   Post-operative Plan: Extubation in OR  Informed Consent: I have reviewed the patients History and Physical, chart, labs and discussed the procedure including the risks, benefits and alternatives for the proposed anesthesia with the patient or authorized representative who has indicated his/her understanding and acceptance.   Dental advisory given  Plan  Discussed with: CRNA  Anesthesia Plan Comments:        Anesthesia Quick Evaluation

## 2018-01-12 NOTE — Plan of Care (Signed)
  Problem: Pain Management: Goal: Pain level will decrease Outcome: Progressing    Problem: Bladder/Genitourinary: Goal: Urinary functional status for postoperative course will improve Outcome: Progressing   Problem: Coping: Goal: Level of anxiety will decrease Outcome: Progressing   Problem: Safety: Goal: Ability to remain free from injury will improve Outcome: Progressing     

## 2018-01-12 NOTE — Evaluation (Signed)
Physical Therapy Evaluation Patient Details Name: [name] [name] MRN: [id number] DOB: 1950-11-22 Today's Date: 01/12/2018   History of Present Illness  patient is a 67 yo female s/p POSTERIOR LUMBAR INTERBODY FUSION  LUMBAR FOUR- LUMBAR FIVE  Clinical Impression  Orders received for PT evaluation. Patient demonstrates deficits in functional mobility as indicated below. Will benefit from continued skilled PT to address deficits and maximize function. Will see as indicated and progress as tolerated.      Follow Up Recommendations No PT follow up;Supervision - Intermittent    Equipment Recommendations  None recommended by PT    Recommendations for Other Services       Precautions / Restrictions Precautions Precautions: Back Precaution Booklet Issued: Yes (comment) Precaution Comments: reviewed verbally with patient Required Braces or Orthoses: Spinal Brace Spinal Brace: Lumbar corset;Applied in sitting position Restrictions Weight Bearing Restrictions: No      Mobility  Bed Mobility Overal bed mobility: Needs Assistance Bed Mobility: Rolling;Sidelying to Sit;Sit to Sidelying Rolling: Supervision Sidelying to sit: Supervision     Sit to sidelying: Min assist General bed mobility comments: min assist, increased time and effort with cues for precautions  Transfers Overall transfer level: Needs assistance Equipment used: 1 person hand held assist Transfers: Sit to/from Stand Sit to Stand: Min assist         General transfer comment: min assist for stability  Ambulation/Gait Ambulation/Gait assistance: Min assist Gait Distance (Feet): 340 Feet Assistive device: 1 person hand held assist Gait Pattern/deviations: Step-through pattern;Decreased stride length;Drifts right/left Gait velocity: decreased   General Gait Details: min assist for stability during ambulation  Stairs            Wheelchair Mobility    Modified Rankin (Stroke Patients Only)        Balance Overall balance assessment: Mild deficits observed, not formally tested                                           Pertinent Vitals/Pain Pain Assessment: 0-10 Pain Score: 4  Pain Location: back Pain Descriptors / Indicators: Sore Pain Intervention(s): Monitored during session    Home Living Family/patient expects to be discharged to:: Private residence Living Arrangements: Spouse/significant other Available Help at Discharge: Family Type of Home: House Home Access: Stairs to enter Entrance Stairs-Rails: None(Door frame on L) Secretary/administrator of Steps: 2 Home Layout: One level Home Equipment: Bedside commode;Walker - 2 wheels Additional Comments: husband son and sister will assist    Prior Function Level of Independence: Independent               Hand Dominance   Dominant Hand: Right    Extremity/Trunk Assessment   Upper Extremity Assessment Upper Extremity Assessment: Overall WFL for tasks assessed    Lower Extremity Assessment Lower Extremity Assessment: Overall WFL for tasks assessed       Communication   Communication: No difficulties  Cognition Arousal/Alertness: Awake/alert Behavior During Therapy: WFL for tasks assessed/performed Overall Cognitive Status: Within Functional Limits for tasks assessed                                        General Comments      Exercises     Assessment/Plan    PT Assessment Patient needs continued PT services  PT Problem  List Decreased strength;Decreased activity tolerance;Decreased balance;Decreased mobility;Decreased knowledge of precautions       PT Treatment Interventions DME instruction;Gait training;Functional mobility training;Therapeutic activities;Therapeutic exercise;Balance training;Stair training;Patient/family education    PT Goals (Current goals can be found in the Care Plan section)  Acute Rehab PT Goals Patient Stated Goal: to go home PT Goal  Formulation: With patient Time For Goal Achievement: 01/26/18 Potential to Achieve Goals: Good    Frequency Min 5X/week   Barriers to discharge        Co-evaluation               AM-PAC PT "6 Clicks" Daily Activity  Outcome Measure Difficulty turning over in bed (including adjusting bedclothes, sheets and blankets)?: A Little Difficulty moving from lying on back to sitting on the side of the bed? : A Little Difficulty sitting down on and standing up from a chair with arms (e.g., wheelchair, bedside commode, etc,.)?: A Little Help needed moving to and from a bed to chair (including a wheelchair)?: A Little Help needed walking in hospital room?: A Little Help needed climbing 3-5 steps with a railing? : A Little 6 Click Score: 18    End of Session Equipment Utilized During Treatment: Gait belt Activity Tolerance: Patient tolerated treatment well Patient left: in bed;with call bell/phone within reach;with family/visitor present;with SCD's reapplied Nurse Communication: Mobility status PT Visit Diagnosis: Unsteadiness on feet (R26.81)    Time: 4098-1191 PT Time Calculation (min) (ACUTE ONLY): 21 min   Charges:   PT Evaluation $PT Eval Moderate Complexity: 1 Mod          Charlotte Crumb, PT DPT  Board Certified Neurologic Specialist Acute Rehabilitation Services Pager [phone] Office [phone]   Fabio Asa 01/12/2018, 4:44 PM

## 2018-01-13 LAB — APTT: aPTT: 27 seconds (ref 24–36)

## 2018-01-13 LAB — BASIC METABOLIC PANEL
Anion gap: 7 (ref 5–15)
BUN: 22 mg/dL (ref 8–23)
CO2: 29 mmol/L (ref 22–32)
Calcium: 8.8 mg/dL — ABNORMAL LOW (ref 8.9–10.3)
Chloride: 102 mmol/L (ref 98–111)
Creatinine, Ser: 0.71 mg/dL (ref 0.44–1.00)
GFR calc Af Amer: >60 mL/min (ref >60)
GFR calc non Af Amer: >60 mL/min (ref >60)
Glucose, Bld: 137 mg/dL — ABNORMAL HIGH (ref 70–99)
Potassium: 3.6 mmol/L (ref 3.5–5.1)
Sodium: 138 mmol/L (ref 135–145)

## 2018-01-13 LAB — CBC
HCT: 33.7 % — ABNORMAL LOW (ref 36.0–46.0)
Hemoglobin: 10.7 g/dL — ABNORMAL LOW (ref 12.0–15.0)
MCH: 29.2 pg (ref 26.0–34.0)
MCHC: 31.8 g/dL (ref 30.0–36.0)
MCV: 91.8 fL (ref 80.0–100.0)
Platelets: 173 10*3/uL (ref 150–400)
RBC: 3.67 MIL/uL — ABNORMAL LOW (ref 3.87–5.11)
RDW: 13.6 % (ref 11.5–15.5)
WBC: 9.3 10*3/uL (ref 4.0–10.5)
nRBC: 0 % (ref 0.0–0.2)

## 2018-01-13 LAB — PROTIME-INR
INR: 1.19
Prothrombin Time: 15 seconds (ref 11.4–15.2)

## 2018-01-13 MED ORDER — OXYCODONE-ACETAMINOPHEN 7.5-325 MG PO TABS: 1.0000 | ORAL_TABLET | ORAL | 0 refills | Status: DC | PRN

## 2018-01-13 MED ORDER — GABAPENTIN 300 MG PO CAPS: 600.0000 mg | ORAL_CAPSULE | Freq: Three times a day (TID) | ORAL | 2 refills | Status: AC

## 2018-01-13 MED ORDER — METHOCARBAMOL 750 MG PO TABS: 750.0000 mg | ORAL_TABLET | Freq: Three times a day (TID) | ORAL | 1 refills | Status: DC | PRN

## 2018-01-13 MED ORDER — MELOXICAM 15 MG PO TABS: 15.0000 mg | ORAL_TABLET | Freq: Every day | ORAL | 6 refills | Status: DC

## 2018-01-13 NOTE — Discharge Instructions (Signed)
Wound Care Keep incision covered and dry for two days.   Do not put any creams, lotions, or ointments on incision. Leave steri-strips on back.  They will fall off by themselves. Activity Walk each and every day, increasing distance each day. No lifting greater than 5 lbs.  Avoid bending, lifting and twisting. No driving for 2 weeks; may ride as a passenger locally. If provided with back brace, wear when out of bed.  It is not necessary to wear brace in bed. Diet Resume your normal diet.  Return to Work Will be discussed at you follow up appointment. Call Your Doctor If Any of These Occur Redness, drainage, or swelling at the wound.  Temperature greater than 101 degrees. Severe pain not relieved by pain medication. Incision starts to come apart. Follow Up Appt Call today for appointment in 1-2 weeks ([phone]) or for problems.  If you have any hardware placed in your spine, you will need an x-ray before your appointment.  

## 2018-01-13 NOTE — Progress Notes (Signed)
Patient is discharged from room 3C11 at this time. Alert and in stable condition. IV site d/c'd and instructions read to patient and daughter with understanding verbalized. Left unit via wheelchair with all belongings at side. 

## 2018-01-13 NOTE — Evaluation (Signed)
Occupational Therapy Evaluation Patient Details Name: [name] [name] MRN: [id number] DOB: Dec 14, 1950 Today's Date: 01/13/2018    History of Present Illness patient is a 67 yo female s/p POSTERIOR LUMBAR INTERBODY FUSION  LUMBAR FOUR- LUMBAR FIVE   Clinical Impression   Patient evaluated by Occupational Therapy with no further acute OT needs identified. All education has been completed and the patient has no further questions. See below for any follow-up Occupational Therapy or equipment needs. OT to sign off. Thank you for referral.      Follow Up Recommendations  No OT follow up    Equipment Recommendations  None recommended by OT    Recommendations for Other Services       Precautions / Restrictions Precautions Precautions: Back Precaution Booklet Issued: Yes (comment) Precaution Comments: reviewed back precautions for adls Required Braces or Orthoses: Spinal Brace Spinal Brace: Lumbar corset;Applied in sitting position Restrictions Weight Bearing Restrictions: No      Mobility Bed Mobility Overal bed mobility: Needs Assistance;Independent                Transfers Overall transfer level: Needs assistance Equipment used: 1 person hand held assist Transfers: Sit to/from Stand Sit to Stand: Modified independent (Device/Increase time)              Balance Overall balance assessment: Mild deficits observed, not formally tested                                         ADL either performed or assessed with clinical judgement   ADL Overall ADL's : Modified independent     Back handout provided and reviewed adls in detail. Pt educated on:  avoid sitting for long periods of time, proper seating position (daughter had questions about recliner at home), correct bed positioning for sleeping, correct sequence for bed mobility, avoiding lifting more than 5 pounds and never wash directly over incision. All education is complete and patient  indicates understanding.                                     General ADL Comments: pt able to dress for home with R LE first and then L LE. pt demonstrates shower transfer. pt will have daughter (A) upon d/c home and present during education.      Vision   Vision Assessment?: No apparent visual deficits     Perception     Praxis      Pertinent Vitals/Pain Pain Assessment: 0-10 Pain Score: 2  Pain Location: back Pain Descriptors / Indicators: Sore Pain Intervention(s): Monitored during session;Premedicated before session;Repositioned     Hand Dominance Right   Extremity/Trunk Assessment Upper Extremity Assessment Upper Extremity Assessment: Overall WFL for tasks assessed   Lower Extremity Assessment Lower Extremity Assessment: Defer to PT evaluation   Cervical / Trunk Assessment Cervical / Trunk Assessment: Other exceptions(s/p surg)   Communication Communication Communication: No difficulties   Cognition Arousal/Alertness: Awake/alert Behavior During Therapy: WFL for tasks assessed/performed Overall Cognitive Status: Within Functional Limits for tasks assessed                                     General Comments  dress with drainage but present and intact at this time.  Exercises     Shoulder Instructions      Home Living Family/patient expects to be discharged to:: Private residence Living Arrangements: Spouse/significant other Available Help at Discharge: Family Type of Home: House Home Access: Stairs to enter Secretary/administrator of Steps: 2 Entrance Stairs-Rails: None(Door frame on L) Home Layout: One level     Bathroom Shower/Tub: Producer, television/film/video: Standard     Home Equipment: Bedside commode;Walker - 2 wheels   Additional Comments: husband son and sister will assist      Prior Functioning/Environment Level of Independence: Independent                 OT Problem List:         OT Treatment/Interventions:      OT Goals(Current goals can be found in the care plan section) Acute Rehab OT Goals Patient Stated Goal: to go home and dress myself  OT Frequency:     Barriers to D/C:            Co-evaluation              AM-PAC PT "6 Clicks" Daily Activity     Outcome Measure Help from another person eating meals?: None Help from another person taking care of personal grooming?: None Help from another person toileting, which includes using toliet, bedpan, or urinal?: None Help from another person bathing (including washing, rinsing, drying)?: None Help from another person to put on and taking off regular upper body clothing?: None Help from another person to put on and taking off regular lower body clothing?: None 6 Click Score: 24   End of Session Nurse Communication: Mobility status;Precautions  Activity Tolerance: Patient tolerated treatment well Patient left: in chair;with call bell/phone within reach;with family/visitor present  OT Visit Diagnosis: Unsteadiness on feet (R26.81)                Time: 1610-9604 OT Time Calculation (min): 13 min Charges:  OT General Charges $OT Visit: 1 Visit OT Evaluation $OT Eval Low Complexity: 1 Low   Mateo Flow, OTR/L  Acute Rehabilitation Services Pager: [phone] Office: [phone] .   Boone Master B 01/13/2018, 8:36 AM

## 2018-01-13 NOTE — Discharge Summary (Signed)
Physician Discharge Summary  Patient ID: [name] [name] MRN: [id number] DOB/AGE: 68-Nov-1952 68 y.o.  Admit date: 01/12/2018 Discharge date: 01/13/2018  Admission Diagnoses:  Lumbar radiculopath  Discharge Diagnoses:  Same Active Problems:   Lumbar radiculopathy  Discharged Condition: Stable  Hospital Course:  [name] [name] is a 67 y.o. female who was admitted for the below procedure. There were no post operative complications. She had complete resolution in her preoperative symptoms. At time of discharge, pain was well controlled, ambulating with Pt/OT, tolerating po, voiding normal. Ready for discharge.  Treatments: Surgery 1. L4 laminectomy with facetectomy for decompression of exiting nerve roots, more than would be required for placement of interbody graft 2. Placement of anterior interbody device - [employer] expandable 8mm lordotic cage X2 3. Posterior non-segmental instrumentation using cortical pedicle screws at L4 - L5 4. Interbody arthrodesis, L4-5 5. Use of locally harvested bone autograft 6. Use of non-structural bone allograft - BMP-2, [place]  Discharge Exam: Blood pressure (!) 175/84, pulse 70, temperature 98.2 F (36.8 C), temperature source Oral, resp. rate 18, SpO2 96 %. Awake, alert, oriented Speech fluent, appropriate CN grossly intact 5/5 BUE/BLE Wound with dried blood on inferior bandage. No active drainage  Disposition: Discharge disposition: 01-Home or Self Care       Discharge Instructions    Call MD for:  difficulty breathing, headache or visual disturbances   Complete by:  As directed    Call MD for:  persistant dizziness or light-headedness   Complete by:  As directed    Call MD for:  redness, tenderness, or signs of infection (pain, swelling, redness, odor or green/yellow discharge around incision site)   Complete by:  As directed    Call MD for:  severe uncontrolled pain   Complete by:  As directed    Call MD for:  temperature  >100.4   Complete by:  As directed    Diet general   Complete by:  As directed    Driving Restrictions   Complete by:  As directed    Do not drive until given clearance.   Increase activity slowly   Complete by:  As directed    Lifting restrictions   Complete by:  As directed    Do not lift anything >10lbs. Avoid bending and twisting in awkward positions. Avoid bending at the back.   May shower / Bathe   Complete by:  As directed    In 24 hours. Okay to wash wound with warm soapy water. Avoid scrubbing the wound. Pat dry.   Remove dressing in 24 hours   Complete by:  As directed      Allergies as of 01/13/2018   No Known Allergies     Medication List    TAKE these medications   diltiazem 240 MG 24 hr capsule Commonly known as:  CARDIZEM CD Take 240 mg by mouth daily.   docusate sodium 100 MG capsule Commonly known as:  COLACE Take 1 capsule (100 mg total) by mouth 2 (two) times daily as needed for mild constipation. What changed:    how much to take  when to take this  reasons to take this   furosemide 20 MG tablet Commonly known as:  LASIX Take 20 mg by mouth daily as needed for fluid.   gabapentin 300 MG capsule Commonly known as:  NEURONTIN Take 2 capsules (600 mg total) by mouth 3 (three) times daily.   hydrochlorothiazide 25 MG tablet Commonly known as:  HYDRODIURIL Take 25  mg by mouth daily.   ibuprofen 200 MG tablet Commonly known as:  ADVIL,MOTRIN Take 3 tablets (600 mg total) by mouth at bedtime as needed for moderate pain. Resume 5 days post-op as needed What changed:    how much to take  when to take this   meloxicam 15 MG tablet Commonly known as:  MOBIC Take 1 tablet (15 mg total) by mouth daily. Start taking on:  01/18/2018 What changed:  These instructions start on 01/18/2018. If you are unsure what to do until then, ask your doctor or other care provider.   methocarbamol 750 MG tablet Commonly known as:  ROBAXIN Take 1 tablet (750  mg total) by mouth 3 (three) times daily as needed for muscle spasms.   metoprolol succinate 100 MG 24 hr tablet Commonly known as:  TOPROL-XL Take 150 mg by mouth daily. Take with or immediately following a meal.   olmesartan 20 MG tablet Commonly known as:  BENICAR Take 20 mg by mouth daily.   oxyCODONE-acetaminophen 7.5-325 MG tablet Commonly known as:  PERCOCET Take 1 tablet by mouth every 4 (four) hours as needed for severe pain.   potassium chloride SA 20 MEQ tablet Commonly known as:  K-DUR,KLOR-CON Take 20 mEq by mouth daily.      Follow-up Information    Lisbeth Renshaw, MD Follow up.   Specialty:  Neurosurgery Contact information: [address] [place] [address] [phone]           Signed: Alyson Ingles 01/13/2018, 7:44 AM

## 2018-01-13 NOTE — Progress Notes (Signed)
Physical Therapy Treatment and Discharge Patient Details Name: MARKETIA STALLSMITH MRN: [id number] DOB: 03/26/1950 Today's Date: 01/13/2018    History of Present Illness patient is a 67 yo female s/p POSTERIOR LUMBAR INTERBODY FUSION  LUMBAR FOUR- LUMBAR FIVE    PT Comments    Pt with minimal pain and progressing well. Pt functioning at supervision level. Pt with good home set up and support. Pt completed stair negotiation at this time. Pt with no further acute PT needs at this time. PT SIGNING OFF. Please re-consult if needed in future.   Follow Up Recommendations  No PT follow up;Supervision - Intermittent     Equipment Recommendations  None recommended by PT    Recommendations for Other Services       Precautions / Restrictions Precautions Precautions: Back Precaution Booklet Issued: Yes (comment) Precaution Comments: reviewed back precautions for adls Required Braces or Orthoses: Spinal Brace(has one at home, for comfort at home) Spinal Brace: Lumbar corset;Applied in sitting position Restrictions Weight Bearing Restrictions: No    Mobility  Bed Mobility Overal bed mobility: Needs Assistance;Independent             General bed mobility comments: pt up in chair upon PT arrival  Transfers Overall transfer level: Needs assistance Equipment used: None Transfers: Sit to/from Stand Sit to Stand: Modified independent (Device/Increase time)         General transfer comment: pt pushed up from chair, increased time  Ambulation/Gait Ambulation/Gait assistance: Supervision Gait Distance (Feet): 200 Feet Assistive device: None Gait Pattern/deviations: Step-through pattern;Decreased stride length;Drifts right/left Gait velocity: decreased   General Gait Details: 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 otherwise steady   Stairs Stairs: Yes Stairs assistance: Min guard Stair Management: Step to pattern;Forwards(HHA) Number of Stairs: 3 General  stair comments: pt with no hand rail at home, instructed on how to do hand held with daughter   Wheelchair Mobility    Modified Rankin (Stroke Patients Only)       Balance Overall balance assessment: Mild deficits observed, not formally tested                                          Cognition Arousal/Alertness: Awake/alert Behavior During Therapy: WFL for tasks assessed/performed Overall Cognitive Status: Within Functional Limits for tasks assessed                                        Exercises      General Comments General comments (skin integrity, edema, etc.): VSS      Pertinent Vitals/Pain Pain Assessment: 0-10 Pain Score: 2  Pain Location: back Pain Descriptors / Indicators: Sore Pain Intervention(s): Monitored during session    Home Living Family/patient expects to be discharged to:: Private residence Living Arrangements: Spouse/significant other Available Help at Discharge: Family Type of Home: House Home Access: Stairs to enter Entrance Stairs-Rails: None(Door frame on L) Home Layout: One level Home Equipment: Bedside commode;Walker - 2 wheels Additional Comments: husband son and sister will assist    Prior Function Level of Independence: Independent          PT Goals (current goals can now be found in the care plan section) Acute Rehab PT Goals Patient Stated Goal: to go home and dress myself PT Goal Formulation: With patient  Time For Goal Achievement: 01/26/18 Potential to Achieve Goals: Good Progress towards PT goals: Progressing toward goals    Frequency    Min 5X/week      PT Plan Current plan remains appropriate    Co-evaluation              AM-PAC PT "6 Clicks" Daily Activity  Outcome Measure  Difficulty turning over in bed (including adjusting bedclothes, sheets and blankets)?: A Little Difficulty moving from lying on back to sitting on the side of the bed? : A Little Difficulty  sitting down on and standing up from a chair with arms (e.g., wheelchair, bedside commode, etc,.)?: A Little Help needed moving to and from a bed to chair (including a wheelchair)?: A Little Help needed walking in hospital room?: A Little Help needed climbing 3-5 steps with a railing? : A Little 6 Click Score: 18    End of Session Equipment Utilized During Treatment: Gait belt Activity Tolerance: Patient tolerated treatment well Patient left: with call bell/phone within reach;with family/visitor present;with SCD's reapplied;in chair Nurse Communication: Mobility status PT Visit Diagnosis: Unsteadiness on feet (R26.81)     Time: 8119-1478 PT Time Calculation (min) (ACUTE ONLY): 13 min  Charges:  $Gait Training: 8-22 mins                     .sing    Iona Hansen 01/13/2018, 8:59 AM

## 2018-07-05 DIAGNOSIS — M5126 Other intervertebral disc displacement, lumbar region: Secondary | ICD-10-CM | POA: Diagnosis not present

## 2018-07-07 DIAGNOSIS — M5126 Other intervertebral disc displacement, lumbar region: Secondary | ICD-10-CM | POA: Diagnosis not present

## 2018-07-21 ENCOUNTER — Telehealth: Payer: Self-pay | Admitting: Neurology

## 2018-07-21 NOTE — Telephone Encounter (Signed)
Status post L4-5 decompression fusion

## 2018-10-15 DIAGNOSIS — R350 Frequency of micturition: Secondary | ICD-10-CM | POA: Diagnosis not present

## 2018-10-15 DIAGNOSIS — N3001 Acute cystitis with hematuria: Secondary | ICD-10-CM | POA: Diagnosis not present

## 2018-10-21 DIAGNOSIS — I10 Essential (primary) hypertension: Secondary | ICD-10-CM | POA: Diagnosis not present

## 2018-10-21 DIAGNOSIS — Z9889 Other specified postprocedural states: Secondary | ICD-10-CM | POA: Diagnosis not present

## 2018-10-21 DIAGNOSIS — E669 Obesity, unspecified: Secondary | ICD-10-CM | POA: Diagnosis not present

## 2018-10-21 DIAGNOSIS — E7800 Pure hypercholesterolemia, unspecified: Secondary | ICD-10-CM | POA: Diagnosis not present

## 2018-10-21 DIAGNOSIS — Z1211 Encounter for screening for malignant neoplasm of colon: Secondary | ICD-10-CM | POA: Diagnosis not present

## 2018-10-21 DIAGNOSIS — R7303 Prediabetes: Secondary | ICD-10-CM | POA: Diagnosis not present

## 2018-10-21 DIAGNOSIS — Z0000 Encounter for general adult medical examination without abnormal findings: Secondary | ICD-10-CM | POA: Diagnosis not present

## 2018-10-21 DIAGNOSIS — Z6836 Body mass index (BMI) 36.0-36.9, adult: Secondary | ICD-10-CM | POA: Diagnosis not present

## 2018-10-25 DIAGNOSIS — R350 Frequency of micturition: Secondary | ICD-10-CM | POA: Diagnosis not present

## 2018-10-25 DIAGNOSIS — I10 Essential (primary) hypertension: Secondary | ICD-10-CM | POA: Diagnosis not present

## 2018-10-25 DIAGNOSIS — N390 Urinary tract infection, site not specified: Secondary | ICD-10-CM | POA: Diagnosis not present

## 2018-10-26 DIAGNOSIS — R350 Frequency of micturition: Secondary | ICD-10-CM | POA: Diagnosis not present

## 2018-11-10 ENCOUNTER — Ambulatory Visit
Admission: RE | Admit: 2018-11-10 | Discharge: 2018-11-10 | Disposition: A | Discharge status: Home / Self Care | Payer: Medicare HMO | Source: Ambulatory Visit | Attending: Family Medicine | Admitting: Family Medicine

## 2018-11-10 ENCOUNTER — Other Ambulatory Visit: Payer: Self-pay | Admitting: Family Medicine

## 2018-11-10 DIAGNOSIS — B964 Proteus (mirabilis) (morganii) as the cause of diseases classified elsewhere: Secondary | ICD-10-CM

## 2018-11-10 DIAGNOSIS — N390 Urinary tract infection, site not specified: Secondary | ICD-10-CM

## 2018-11-10 DIAGNOSIS — K5730 Diverticulosis of large intestine without perforation or abscess without bleeding: Secondary | ICD-10-CM | POA: Diagnosis not present

## 2018-11-10 DIAGNOSIS — Z1211 Encounter for screening for malignant neoplasm of colon: Secondary | ICD-10-CM | POA: Diagnosis not present

## 2018-11-19 DIAGNOSIS — R351 Nocturia: Secondary | ICD-10-CM | POA: Diagnosis not present

## 2018-11-19 DIAGNOSIS — R350 Frequency of micturition: Secondary | ICD-10-CM | POA: Diagnosis not present

## 2018-11-25 DIAGNOSIS — N390 Urinary tract infection, site not specified: Secondary | ICD-10-CM | POA: Diagnosis not present

## 2018-12-10 DIAGNOSIS — N281 Cyst of kidney, acquired: Secondary | ICD-10-CM | POA: Diagnosis not present

## 2019-01-05 DIAGNOSIS — Z6835 Body mass index (BMI) 35.0-35.9, adult: Secondary | ICD-10-CM | POA: Diagnosis not present

## 2019-01-05 DIAGNOSIS — M5126 Other intervertebral disc displacement, lumbar region: Secondary | ICD-10-CM | POA: Diagnosis not present

## 2019-01-12 DIAGNOSIS — R351 Nocturia: Secondary | ICD-10-CM | POA: Diagnosis not present

## 2019-01-12 DIAGNOSIS — Z87440 Personal history of urinary (tract) infections: Secondary | ICD-10-CM | POA: Diagnosis not present

## 2019-03-12 DIAGNOSIS — U071 COVID-19: Secondary | ICD-10-CM | POA: Diagnosis not present

## 2019-03-12 DIAGNOSIS — J029 Acute pharyngitis, unspecified: Secondary | ICD-10-CM | POA: Diagnosis not present

## 2019-03-24 DIAGNOSIS — R05 Cough: Secondary | ICD-10-CM | POA: Diagnosis not present

## 2019-03-24 DIAGNOSIS — U071 COVID-19: Secondary | ICD-10-CM | POA: Diagnosis not present

## 2019-05-03 DIAGNOSIS — U071 COVID-19: Secondary | ICD-10-CM | POA: Diagnosis not present

## 2019-05-03 DIAGNOSIS — I499 Cardiac arrhythmia, unspecified: Secondary | ICD-10-CM | POA: Diagnosis not present

## 2019-05-03 DIAGNOSIS — E669 Obesity, unspecified: Secondary | ICD-10-CM | POA: Diagnosis not present

## 2019-05-03 DIAGNOSIS — I10 Essential (primary) hypertension: Secondary | ICD-10-CM | POA: Diagnosis not present

## 2019-08-29 DIAGNOSIS — H52221 Regular astigmatism, right eye: Secondary | ICD-10-CM | POA: Diagnosis not present

## 2019-08-29 DIAGNOSIS — H524 Presbyopia: Secondary | ICD-10-CM | POA: Diagnosis not present

## 2019-08-29 DIAGNOSIS — H5213 Myopia, bilateral: Secondary | ICD-10-CM | POA: Diagnosis not present

## 2019-10-26 DIAGNOSIS — Z6839 Body mass index (BMI) 39.0-39.9, adult: Secondary | ICD-10-CM | POA: Diagnosis not present

## 2019-10-26 DIAGNOSIS — M545 Low back pain: Secondary | ICD-10-CM | POA: Diagnosis not present

## 2019-10-26 DIAGNOSIS — I10 Essential (primary) hypertension: Secondary | ICD-10-CM | POA: Diagnosis not present

## 2019-10-27 DIAGNOSIS — Z0000 Encounter for general adult medical examination without abnormal findings: Secondary | ICD-10-CM | POA: Diagnosis not present

## 2019-10-27 DIAGNOSIS — R7303 Prediabetes: Secondary | ICD-10-CM | POA: Diagnosis not present

## 2019-10-27 DIAGNOSIS — Z1211 Encounter for screening for malignant neoplasm of colon: Secondary | ICD-10-CM | POA: Diagnosis not present

## 2019-10-27 DIAGNOSIS — I10 Essential (primary) hypertension: Secondary | ICD-10-CM | POA: Diagnosis not present

## 2019-10-27 DIAGNOSIS — F320 Major depressive disorder, single episode, mild: Secondary | ICD-10-CM | POA: Diagnosis not present

## 2019-10-27 DIAGNOSIS — M5416 Radiculopathy, lumbar region: Secondary | ICD-10-CM | POA: Diagnosis not present

## 2019-10-27 DIAGNOSIS — E2839 Other primary ovarian failure: Secondary | ICD-10-CM | POA: Diagnosis not present

## 2019-10-27 DIAGNOSIS — E7800 Pure hypercholesterolemia, unspecified: Secondary | ICD-10-CM | POA: Diagnosis not present

## 2019-10-31 ENCOUNTER — Other Ambulatory Visit: Payer: Self-pay | Admitting: Family Medicine

## 2019-10-31 DIAGNOSIS — E2839 Other primary ovarian failure: Secondary | ICD-10-CM

## 2019-12-05 DIAGNOSIS — Z1211 Encounter for screening for malignant neoplasm of colon: Secondary | ICD-10-CM | POA: Diagnosis not present

## 2020-07-02 ENCOUNTER — Encounter (HOSPITAL_COMMUNITY): Payer: Self-pay

## 2020-07-02 ENCOUNTER — Ambulatory Visit (HOSPITAL_COMMUNITY)
Admission: EM | Admit: 2020-07-02 | Discharge: 2020-07-02 | Disposition: A | Discharge status: Home / Self Care | Payer: Medicare HMO | Attending: Urgent Care | Admitting: Urgent Care

## 2020-07-02 DIAGNOSIS — I10 Essential (primary) hypertension: Secondary | ICD-10-CM | POA: Diagnosis not present

## 2020-07-02 DIAGNOSIS — R350 Frequency of micturition: Secondary | ICD-10-CM | POA: Diagnosis not present

## 2020-07-02 DIAGNOSIS — R030 Elevated blood-pressure reading, without diagnosis of hypertension: Secondary | ICD-10-CM | POA: Diagnosis not present

## 2020-07-02 DIAGNOSIS — R739 Hyperglycemia, unspecified: Secondary | ICD-10-CM | POA: Diagnosis not present

## 2020-07-02 LAB — CBG MONITORING, ED: Glucose-Capillary: 168 mg/dL — ABNORMAL HIGH (ref 70–99)

## 2020-07-02 NOTE — ED Triage Notes (Addendum)
Pt in with c/o urinary frequency that started on Friday. Also c/o feeling weak  Pt states she felt like she was retaining fluid at first but then she started to urinate more  Denies any N/v

## 2020-07-02 NOTE — ED Provider Notes (Addendum)
[name]   MRN: [id number] DOB: Jul 22, 1950  Subjective:   [name] [name] is a 70 y.o. female presenting for 3 to 4-day history of urinary frequency, swelling.  Patient states she has a history of prediabetes, hypertension and retains fluid regularly.  Admits that she has been eating out a lot in the past week, has been ordering takeout.  Denies headache, confusion, chest pain, shortness of breath, abdominal pain, dysuria, hematuria, numbness or tingling.  She is taking hydrochlorothiazide at 25 mg once daily, takes her potassium supplement with this as well.  She does have a prescription for furosemide but has not been using this.  No current facility-administered medications for this encounter.  Current Outpatient Medications:  .  diltiazem (CARDIZEM CD) 240 MG 24 hr capsule, Take 240 mg by mouth daily.  , Disp: , Rfl:  .  docusate sodium (COLACE) 100 MG capsule, Take 1 capsule (100 mg total) by mouth 2 (two) times daily as needed for mild constipation. (Patient taking differently: Take 200 mg by mouth at bedtime as needed (constipation). ), Disp: 30 capsule, Rfl: 1 .  furosemide (LASIX) 20 MG tablet, Take 20 mg by mouth daily as needed for fluid., Disp: , Rfl:  .  gabapentin (NEURONTIN) 300 MG capsule, Take 2 capsules (600 mg total) by mouth 3 (three) times daily., Disp: 90 capsule, Rfl: 2 .  hydrochlorothiazide (HYDRODIURIL) 25 MG tablet, Take 25 mg by mouth daily., Disp: , Rfl:  .  ibuprofen (ADVIL,MOTRIN) 200 MG tablet, Take 3 tablets (600 mg total) by mouth at bedtime as needed for moderate pain. Resume 5 days post-op as needed (Patient taking differently: Take 400-600 mg by mouth every 8 (eight) hours as needed for moderate pain. Resume 5 days post-op as needed), Disp: 30 tablet, Rfl: 0 .  meloxicam (MOBIC) 15 MG tablet, Take 1 tablet (15 mg total) by mouth daily., Disp: 30 tablet, Rfl: 6 .  methocarbamol (ROBAXIN-750) 750 MG tablet, Take 1 tablet (750 mg  total) by mouth 3 (three) times daily as needed for muscle spasms., Disp: 90 tablet, Rfl: 1 .  metoprolol succinate (TOPROL-XL) 100 MG 24 hr tablet, Take 150 mg by mouth daily. Take with or immediately following a meal., Disp: , Rfl:  .  olmesartan (BENICAR) 20 MG tablet, Take 20 mg by mouth daily., Disp: , Rfl:  .  oxyCODONE-acetaminophen (PERCOCET) 7.5-325 MG tablet, Take 1 tablet by mouth every 4 (four) hours as needed for severe pain., Disp: 60 tablet, Rfl: 0 .  potassium chloride SA (K-DUR,KLOR-CON) 20 MEQ tablet, Take 20 mEq by mouth daily., Disp: , Rfl:    No Known Allergies  Past Medical History:  Diagnosis Date  . Abdominal pain   . Arthritis   . Complication of anesthesia    Hard to wake up.  Marland Kitchen Heart murmur   . Hypercholesteremia   . Hypertension   . Left leg DVT (HCC) 06/2008   s/p knee surgery  . Left leg pain   . Pneumonia   . Prediabetes      Past Surgical History:  Procedure Laterality Date  . BACK SURGERY     Revision of microlumbardecompression 04-16-17 Dr. Shelle Iron  . CHOLECYSTECTOMY  12/24/10  . KNEE SURGERY  2010   left mcl   . LUMBAR LAMINECTOMY/DECOMPRESSION MICRODISCECTOMY Left 10/31/2016   Procedure: Microlumbar decompression L4-5 left ;  Surgeon: Jene Every, MD;  Location: [facility];  Service: Orthopedics;  Laterality: Left;  90 mins  . LUMBAR  LAMINECTOMY/DECOMPRESSION MICRODISCECTOMY N/A 04/16/2017   Procedure: Revision microlumbar decompression L4-5;  Surgeon: Jene Every, MD;  Location: [facility];  Service: Orthopedics;  Laterality: N/A;  2 hrs  . TUBAL LIGATION      Family History  Problem Relation Age of Onset  . Heart disease Mother   . Heart disease Father     Social History   Tobacco Use  . Smoking status: Never Smoker  . Smokeless tobacco: Never Used  Vaping Use  . Vaping Use: Never used  Substance Use Topics  . Alcohol use: No  . Drug use: No    ROS   Objective:   Vitals: BP (!) 158/108   Pulse 92   Temp 98.2 F (36.8 C)    Resp 18   SpO2 97%   Physical Exam Constitutional:      General: She is not in acute distress.    Appearance: Normal appearance. She is well-developed and normal weight. She is not ill-appearing, toxic-appearing or diaphoretic.  HENT:     Head: Normocephalic and atraumatic.     Right Ear: External ear normal.     Left Ear: External ear normal.     Nose: Nose normal.     Mouth/Throat:     Mouth: Mucous membranes are moist.     Pharynx: Oropharynx is clear.  Eyes:     General: No scleral icterus.       Right eye: No discharge.        Left eye: No discharge.     Extraocular Movements: Extraocular movements intact.     Conjunctiva/sclera: Conjunctivae normal.     Pupils: Pupils are equal, round, and reactive to light.  Cardiovascular:     Rate and Rhythm: Normal rate and regular rhythm.     Heart sounds: Normal heart sounds. No murmur heard. No friction rub. No gallop.   Pulmonary:     Effort: Pulmonary effort is normal. No respiratory distress.     Breath sounds: Normal breath sounds. No stridor. No wheezing, rhonchi or rales.  Abdominal:     General: Bowel sounds are normal. There is no distension.     Palpations: Abdomen is soft. There is no mass.     Tenderness: There is no abdominal tenderness. There is no right CVA tenderness, left CVA tenderness, guarding or rebound.  Musculoskeletal:     Right lower leg: Edema (1+ up to distal calf) present.     Left lower leg: Edema (1+ up to distal calf) present.  Skin:    General: Skin is warm and dry.     Coloration: Skin is not pale.     Findings: No rash.  Neurological:     General: No focal deficit present.     Mental Status: She is alert and oriented to person, place, and time.     Motor: No weakness.     Coordination: Coordination normal.     Gait: Gait normal.     Deep Tendon Reflexes: Reflexes normal.  Psychiatric:        Mood and Affect: Mood normal.        Behavior: Behavior normal.        Thought Content: Thought  content normal.        Judgment: Judgment normal.    Results for orders placed or performed during the hospital encounter of 07/02/20 (from the past 24 hour(s))  POC CBG monitoring     Status: Abnormal   Collection Time: 07/02/20  9:42 AM  Result  Value Ref Range   Glucose-Capillary 168 (H) 70 - 99 mg/dL   Assessment and Plan :   PDMP not reviewed this encounter.  1. Urinary frequency   2. Essential hypertension   3. Elevated blood pressure reading   4. Hyperglycemia     Patient reports that she had a Gatorade prior to coming into the clinic.  Therefore her blood sugar is technically not fasting.  However, emphasized that she needs a recheck with her regular doctor for fasting blood sugar check, diabetes work-up especially with her urinary frequency, history of elevated blood sugars and dry mouth.  In the meantime, counseled on healthy well-balanced diet.  Recommended stopping any form of takeout or food as she does not prepare herself.  I discontinued the urinalysis as patient was unable to provide a urine sample, have low suspicion for urinary tract infection. Counseled patient on potential for adverse effects with medications prescribed/recommended today, ER and return-to-clinic precautions discussed, patient verbalized understanding.     Wallis Bamberg, PA-C 07/02/20 1002

## 2020-07-08 DIAGNOSIS — M25561 Pain in right knee: Secondary | ICD-10-CM | POA: Diagnosis not present

## 2020-07-19 DIAGNOSIS — M25561 Pain in right knee: Secondary | ICD-10-CM | POA: Diagnosis not present

## 2020-08-08 ENCOUNTER — Other Ambulatory Visit: Payer: Self-pay | Admitting: Orthopedic Surgery

## 2020-08-08 DIAGNOSIS — M25561 Pain in right knee: Secondary | ICD-10-CM

## 2020-08-15 ENCOUNTER — Ambulatory Visit
Admission: RE | Admit: 2020-08-15 | Discharge: 2020-08-15 | Disposition: A | Discharge status: Home / Self Care | Payer: Medicare HMO | Source: Ambulatory Visit | Attending: Orthopedic Surgery | Admitting: Orthopedic Surgery

## 2020-08-15 ENCOUNTER — Other Ambulatory Visit: Payer: Self-pay

## 2020-08-15 DIAGNOSIS — I10 Essential (primary) hypertension: Secondary | ICD-10-CM | POA: Diagnosis not present

## 2020-08-15 DIAGNOSIS — M25561 Pain in right knee: Secondary | ICD-10-CM | POA: Diagnosis not present

## 2020-08-16 ENCOUNTER — Other Ambulatory Visit: Payer: Self-pay | Admitting: Orthopedic Surgery

## 2020-08-16 DIAGNOSIS — M25561 Pain in right knee: Secondary | ICD-10-CM

## 2020-08-30 DIAGNOSIS — H25813 Combined forms of age-related cataract, bilateral: Secondary | ICD-10-CM | POA: Diagnosis not present

## 2020-08-30 DIAGNOSIS — H524 Presbyopia: Secondary | ICD-10-CM | POA: Diagnosis not present

## 2020-08-30 DIAGNOSIS — H5213 Myopia, bilateral: Secondary | ICD-10-CM | POA: Diagnosis not present

## 2020-08-30 DIAGNOSIS — H52201 Unspecified astigmatism, right eye: Secondary | ICD-10-CM | POA: Diagnosis not present

## 2020-09-06 DIAGNOSIS — M25562 Pain in left knee: Secondary | ICD-10-CM | POA: Diagnosis not present

## 2020-09-06 DIAGNOSIS — M25561 Pain in right knee: Secondary | ICD-10-CM | POA: Diagnosis not present

## 2020-09-06 DIAGNOSIS — M1712 Unilateral primary osteoarthritis, left knee: Secondary | ICD-10-CM | POA: Diagnosis not present

## 2020-10-08 DIAGNOSIS — S83241A Other tear of medial meniscus, current injury, right knee, initial encounter: Secondary | ICD-10-CM | POA: Diagnosis not present

## 2020-10-08 DIAGNOSIS — M1712 Unilateral primary osteoarthritis, left knee: Secondary | ICD-10-CM | POA: Diagnosis not present

## 2020-11-06 DIAGNOSIS — M5416 Radiculopathy, lumbar region: Secondary | ICD-10-CM | POA: Diagnosis not present

## 2020-11-06 DIAGNOSIS — Z1211 Encounter for screening for malignant neoplasm of colon: Secondary | ICD-10-CM | POA: Diagnosis not present

## 2020-11-06 DIAGNOSIS — F320 Major depressive disorder, single episode, mild: Secondary | ICD-10-CM | POA: Diagnosis not present

## 2020-11-06 DIAGNOSIS — Z0000 Encounter for general adult medical examination without abnormal findings: Secondary | ICD-10-CM | POA: Diagnosis not present

## 2020-11-06 DIAGNOSIS — R7303 Prediabetes: Secondary | ICD-10-CM | POA: Diagnosis not present

## 2020-11-06 DIAGNOSIS — E2839 Other primary ovarian failure: Secondary | ICD-10-CM | POA: Diagnosis not present

## 2020-11-06 DIAGNOSIS — E7800 Pure hypercholesterolemia, unspecified: Secondary | ICD-10-CM | POA: Diagnosis not present

## 2020-11-06 DIAGNOSIS — I10 Essential (primary) hypertension: Secondary | ICD-10-CM | POA: Diagnosis not present

## 2020-11-08 ENCOUNTER — Other Ambulatory Visit: Payer: Self-pay | Admitting: Family Medicine

## 2020-11-08 DIAGNOSIS — E2839 Other primary ovarian failure: Secondary | ICD-10-CM

## 2020-11-27 ENCOUNTER — Other Ambulatory Visit: Payer: Self-pay | Admitting: Family Medicine

## 2020-11-27 DIAGNOSIS — Z1231 Encounter for screening mammogram for malignant neoplasm of breast: Secondary | ICD-10-CM

## 2020-12-27 DIAGNOSIS — Z1211 Encounter for screening for malignant neoplasm of colon: Secondary | ICD-10-CM | POA: Diagnosis not present

## 2021-01-16 ENCOUNTER — Telehealth: Payer: Self-pay | Admitting: Cardiovascular Disease

## 2021-01-16 NOTE — Telephone Encounter (Signed)
   [facility] Pre-operative Risk Assessment    Patient Name: Saranne Crislip  DOB: 09-22-1950 MRN: [id number]  HEARTCARE STAFF:  - IMPORTANT!!!!!! Under Visit Info/Reason for Call, type in Other and utilize the format Clearance MM/DD/YY or Clearance TBD. Do not use dashes or single digits. - Please review there is not already an duplicate clearance open for this procedure. - If request is for dental extraction, please clarify the # of teeth to be extracted. - If the patient is currently at the dentist's office, call Pre-Op Callback Staff (MA/nurse) to input urgent request.  - If the patient is not currently in the dentist office, please route to the Pre-Op pool.  Request for surgical clearance:  What type of surgery is being performed? Right Knee Arthroscopy  When is this surgery scheduled? TBD  What type of clearance is required (medical clearance vs. Pharmacy clearance to hold med vs. Both)? Both   Are there any medications that need to be held prior to surgery and how long? TBD by cardiologist  Practice name and name of physician performing surgery? [facility]; Dr. Dorna Leitz  What is the office phone number? [phone]   7.   What is the office fax number? [phone]  8.   Anesthesia type (None, local, MAC, general) ? Choice   Durel Salts 01/16/2021, 11:27 AM  _________________________________________________________________   (provider comments below)

## 2021-01-16 NOTE — Telephone Encounter (Signed)
Pt has a new pt appt 01/22/21 with Dr. Duke Salvia which pre op clearance will be addressed at that time. I will forward notes to MD for upcoming appt. Will send FYI to requesting office pt has appt 01/22/21 as new pt .

## 2021-01-16 NOTE — Telephone Encounter (Signed)
Patient has never been seen by our office. However, she has a New Patient visit with Dr. Duke Salvia scheduled for  next week on 01/22/2021. Pre-op risk assessment can be addressed at that time. I will route this clearance form to Dr. Duke Salvia so that she is aware and will add "pre-op evaluation" to that appointment note.   Pre-op covering staff, can you please notify surgeon's office of need for this appointment? Thank you!  I will remove from pre-op pool.  Corrin Parker, PA-C 01/16/2021 11:59 AM

## 2021-01-17 DIAGNOSIS — M1712 Unilateral primary osteoarthritis, left knee: Secondary | ICD-10-CM | POA: Diagnosis not present

## 2021-01-17 DIAGNOSIS — M25561 Pain in right knee: Secondary | ICD-10-CM | POA: Diagnosis not present

## 2021-01-22 ENCOUNTER — Encounter (HOSPITAL_BASED_OUTPATIENT_CLINIC_OR_DEPARTMENT_OTHER): Payer: Self-pay | Admitting: Cardiovascular Disease

## 2021-01-22 ENCOUNTER — Ambulatory Visit (HOSPITAL_BASED_OUTPATIENT_CLINIC_OR_DEPARTMENT_OTHER): Payer: Medicare HMO | Admitting: Cardiovascular Disease

## 2021-01-22 ENCOUNTER — Encounter: Payer: Self-pay | Admitting: *Deleted

## 2021-01-22 ENCOUNTER — Other Ambulatory Visit: Payer: Self-pay

## 2021-01-22 VITALS — BP 193/81 | HR 62 | Ht 66.0 in | Wt 243.0 lb

## 2021-01-22 DIAGNOSIS — I10 Essential (primary) hypertension: Secondary | ICD-10-CM

## 2021-01-22 DIAGNOSIS — I350 Nonrheumatic aortic (valve) stenosis: Secondary | ICD-10-CM | POA: Insufficient documentation

## 2021-01-22 DIAGNOSIS — Z006 Encounter for examination for normal comparison and control in clinical research program: Secondary | ICD-10-CM

## 2021-01-22 DIAGNOSIS — R0989 Other specified symptoms and signs involving the circulatory and respiratory systems: Secondary | ICD-10-CM

## 2021-01-22 DIAGNOSIS — Z5181 Encounter for therapeutic drug level monitoring: Secondary | ICD-10-CM | POA: Diagnosis not present

## 2021-01-22 DIAGNOSIS — I1A0 Resistant hypertension: Secondary | ICD-10-CM | POA: Insufficient documentation

## 2021-01-22 HISTORY — DX: Resistant hypertension: I1A.0

## 2021-01-22 HISTORY — DX: Essential (primary) hypertension: I10

## 2021-01-22 HISTORY — DX: Nonrheumatic aortic (valve) stenosis: I35.0

## 2021-01-22 HISTORY — DX: Other specified symptoms and signs involving the circulatory and respiratory systems: R09.89

## 2021-01-22 MED ORDER — SPIRONOLACTONE 25 MG PO TABS: 25.0000 mg | ORAL_TABLET | Freq: Every day | ORAL | 3 refills | Status: DC

## 2021-01-22 MED ORDER — AMLODIPINE BESYLATE 10 MG PO TABS: 10.0000 mg | ORAL_TABLET | Freq: Every day | ORAL | 3 refills | Status: DC

## 2021-01-22 NOTE — Assessment & Plan Note (Signed)
Right carotid bruit noted with radiation into her right upper extremity.  Check carotid Dopplers.

## 2021-01-22 NOTE — Progress Notes (Signed)
[facility] Initial Assessment:    Date:  01/22/2021   ID:  [name] [name], DOB Feb 28, 1951, MRN [id number]  PCP:  Shon Hale, MD  Cardiologist:  None  Nephrologist:  Referring MD: Shon Hale, *   CC: Hypertension  History of Present Illness:    [name] [name] is a 70 y.o. female with a hx of hypertension, morbid obesity, pre-diabetes, and hyperlipidemia here to establish care in the [facility].  She last saw Dr. Chanetta Marshall  on 11/2020 and her blood pressure at that visit was 186/98. She was started on HCTZ, olmesartan, diltiazem, and metoprolol there then was referred to the [facility].  Today, she is doing well. She reports having high blood pressure after her first pregnancy and it was always been hard to control. In the past, she lost weight but still had problems controlling her blood pressure. She has been on her current medication for a long time. At home, she records her blood pressure and reports measurements ranging 140/60s to 180/90s. She has had 3 back surgeries in the past. During her last back surgery, her blood pressure was still high. However, the anesthesiologist gave her medicine to bring the pressure down which was successful. Her L knee is "almost gone" but her R knee bothers her more with pain and swelling. She was denied L knee surgery last week because they could not bring her blood pressure down. She reports all her siblings and both parent have high blood pressure but her's is the only one that is difficult to control. Her father had congestive heart failure. Her and her children and siblings have heart murmurs. However, her children do not have high blood pressure. Her bilateral knee pain prevents her from exercising. She takes ibuprofen at night and 3 potassium tablets daily. She used to walk which helped her lose weight. She does have an elliptical at home but using it aggravates  her knee pain. She endorses snoring. However, she wakes up feeling rested and is capable of staying awake all day. For her diet, she does not add salt to food and does not drink caffeine or alcohol. She denies any palpitations, chest pain, shortness of breath, lightheadedness, headaches, syncope, orthopnea, or PND.  Past Medical History:  Diagnosis Date   Abdominal pain    Aortic stenosis 01/22/2021   Arthritis    Carotid bruit 01/22/2021   Complication of anesthesia    Hard to wake up.   Heart murmur    Hypercholesteremia    Hypertension    Left leg DVT (HCC) 06/2008   s/p knee surgery   Left leg pain    Pneumonia    Prediabetes    Resistant hypertension 01/22/2021    Past Surgical History:  Procedure Laterality Date   BACK SURGERY     Revision of microlumbardecompression 04-16-17 Dr. Shelle Iron   CHOLECYSTECTOMY  12/24/10   KNEE SURGERY  2010   left mcl    LUMBAR LAMINECTOMY/DECOMPRESSION MICRODISCECTOMY Left 10/31/2016   Procedure: Microlumbar decompression L4-5 left ;  Surgeon: Jene Every, MD;  Location: [facility];  Service: Orthopedics;  Laterality: Left;  90 mins   LUMBAR LAMINECTOMY/DECOMPRESSION MICRODISCECTOMY N/A 04/16/2017   Procedure: Revision microlumbar decompression L4-5;  Surgeon: Jene Every, MD;  Location: [facility];  Service: Orthopedics;  Laterality: N/A;  2 hrs   TUBAL LIGATION      Current Medications: Current Meds  Medication Sig   amLODipine (NORVASC) 10 MG tablet Take 1 tablet (  10 mg total) by mouth daily.   docusate sodium (COLACE) 100 MG capsule Take 1 capsule (100 mg total) by mouth 2 (two) times daily as needed for mild constipation. (Patient taking differently: Take 200 mg by mouth at bedtime as needed (constipation).)   furosemide (LASIX) 20 MG tablet Take 20 mg by mouth daily as needed for fluid.   gabapentin (NEURONTIN) 300 MG capsule Take 2 capsules (600 mg total) by mouth 3 (three) times daily.   hydrochlorothiazide (HYDRODIURIL) 25 MG tablet Take 25  mg by mouth daily.   ibuprofen (ADVIL,MOTRIN) 200 MG tablet Take 3 tablets (600 mg total) by mouth at bedtime as needed for moderate pain. Resume 5 days post-op as needed (Patient taking differently: Take 400-600 mg by mouth every 8 (eight) hours as needed for moderate pain. Resume 5 days post-op as needed)   meloxicam (MOBIC) 15 MG tablet Take 1 tablet (15 mg total) by mouth daily.   methocarbamol (ROBAXIN-750) 750 MG tablet Take 1 tablet (750 mg total) by mouth 3 (three) times daily as needed for muscle spasms.   metoprolol succinate (TOPROL-XL) 100 MG 24 hr tablet Take 150 mg by mouth daily. Take with or immediately following a meal.   olmesartan (BENICAR) 40 MG tablet Take 1 tablet by mouth daily.   oxyCODONE-acetaminophen (PERCOCET) 7.5-325 MG tablet Take 1 tablet by mouth every 4 (four) hours as needed for severe pain.   spironolactone (ALDACTONE) 25 MG tablet Take 1 tablet (25 mg total) by mouth daily.   traMADol (ULTRAM) 50 MG tablet Take 50-100 mg by mouth every 6 (six) hours as needed.   [DISCONTINUED] diltiazem (CARDIZEM CD) 240 MG 24 hr capsule Take 240 mg by mouth daily.     [DISCONTINUED] potassium chloride SA (K-DUR,KLOR-CON) 20 MEQ tablet Take 20 mEq by mouth daily.     Allergies:   Patient has no known allergies.   Social History   Socioeconomic History   Marital status: Married    Spouse name: Not on file   Number of children: 2   Years of education: 12   Highest education level: High school graduate  Occupational History   Occupation: Sports coach  Tobacco Use   Smoking status: Never   Smokeless tobacco: Never  Vaping Use   Vaping Use: Never used  Substance and Sexual Activity   Alcohol use: No   Drug use: No   Sexual activity: Not Currently  Other Topics Concern   Not on file  Social History Narrative   Lives at home with husband.   Right-handed.   No caffeine use.   Social Determinants of Health   Financial Resource Strain: Low Risk    Difficulty  of Paying Living Expenses: Not hard at all  Food Insecurity: No Food Insecurity   Worried About Programme researcher, broadcasting/film/video in the Last Year: Never true   Ran Out of Food in the Last Year: Never true  Transportation Needs: No Transportation Needs   Lack of Transportation (Medical): No   Lack of Transportation (Non-Medical): No  Physical Activity: Inactive   Days of Exercise per Week: 0 days   Minutes of Exercise per Session: 0 min  Stress: Not on file  Social Connections: Not on file     Family History: [name] family history includes Heart disease in her father and mother; Heart failure in her father; Hypertension in her brother, father, mother, and sister.  ROS:   Please see the history of present illness.    (+)  Bilateral knee pain (R>L) (+) R knee swelling (+) Snores All other systems reviewed and are negative.  EKGs/Labs/Other Studies Reviewed:     EKG:  EKG was not ordered today  Recent Labs: No results found for requested labs within last 8760 hours.   Recent Lipid Panel No results found for: CHOL, TRIG, HDL, CHOLHDL, VLDL, LDLCALC, LDLDIRECT  Physical Exam:   VS:  BP (!) 193/81 (BP Location: Left Arm, Patient Position: Sitting, Cuff Size: Large)   Pulse 62   Ht 5\' 6"  (1.676 m)   Wt 243 lb (110.2 kg)   SpO2 96%   BMI 39.22 kg/m  , BMI Body mass index is 39.22 kg/m. GENERAL:  Well appearing HEENT: Pupils equal round and reactive, fundi not visualized, oral mucosa unremarkable NECK:  No jugular venous distention, waveform within normal limits, carotid upstroke brisk and symmetric, R carotid bruit, no thyromegaly LUNGS:  Clear to auscultation bilaterally HEART:  RRR.  PMI not displaced or sustained,S1 and S2 within normal limits, no S3, no S4, no clicks, no rubs, 2/6 murmur ABD:  Flat, positive bowel sounds normal in frequency in pitch, no bruits, no rebound, no guarding, no midline pulsatile mass, no hepatomegaly, no splenomegaly EXT:  2 plus pulses throughout, no  edema, no cyanosis no clubbing SKIN:  No rashes no nodules NEURO:  Cranial nerves II through XII grossly intact, motor grossly intact throughout PSYCH:  Cognitively intact, oriented to person place and time   ASSESSMENT/PLAN:    Resistant hypertension Her blood pressure is extremely elevated despite being on multiple medications.  On review of her prior imaging she had a CT of the abdomen that was concerning for an adenoma.  There is no contrast with this study.  We will get a CTA of the abdomen to assess for adrenal adenoma as well as patency of her renal arteries.  Given her body habitus I am concerned that renal artery Dopplers would be inconclusive.  She is also had hypokalemia.  We will start spironolactone 25 mg daily.  Continue telmisartan, metoprolol, and hydrochlorothiazide.  Stop diltiazem and switch to amlodipine.  Ultimately, she should have renal and aldosterone checked.  However we cannot do this while she is on olmesartan and her blood pressure is too poorly controlled to stop any agents at this time.  We did discuss the PREP program through the [facility].  However she is unable to participate at this time due to severe knee pain.  We will consider this after she has surgery.  She is interested in our remote patient monitoring study and consents to monitoring through the [employer] remote patient monitoring system.  We also discussed limiting NSAID use.  Carotid bruit Right carotid bruit noted with radiation into her right upper extremity.  Check carotid Dopplers.  Aortic stenosis Mild aortic stenosis noted on echo in 2015.  She is completely asymptomatic and her murmur is very quiet and early peaking.  I do not suspect clinically significant aortic stenosis.  Consider repeating a baseline echo in the future.  Mean gradient was 12 mmHg.   Screening for Secondary Hypertension: { Click here to document screening for secondary causes of HTN Causes 01/22/2021  Drugs/Herbals Screened     -  Comments limits salt.  She doesn't use meloxicam.  She uses ibuprofen bid.  Renovascular HTN Screened     - Comments Check CT abdomen  Sleep Apnea Screened     - Comments Snores.  No other symptoms.  Thyroid Disease Screened     -  Comments Check TSH  Hyperaldosteronism Screened     - Comments CTA abdomen.  We will check renin and aldosterone once her blood pressure is better controlled and olmesartan can be held.  Pheochromocytoma Screened     - Comments CTA abdomen.  She is asymptomatic.  Cushing's Syndrome Not Screened  Hyperparathyroidism Screened  Coarctation of the Aorta Screened     - Comments Blood pressure symmetric but she has a bruit in the right carotid and into the right upper extremity.  We will get carotid ultrasound.  Compliance Screened    Relevant Labs/Studies: Basic Labs Latest Ref Rng & Units 01/13/2018 01/01/2018 04/17/2017  Sodium 135 - 145 mmol/L 138 139 138  Potassium 3.5 - 5.1 mmol/L 3.6 3.4(L) 3.5  Creatinine 0.44 - 1.00 mg/dL 0.45 4.09 8.11                Disposition:    FU with MD/PharmD in 1 months  FU with Tereza Gilham C. Duke Salvia, MD, [facility] in 4 months    Medication Adjustments/Labs and Tests Ordered: Current medicines are reviewed at length with the patient today.  Concerns regarding medicines are outlined above.  Orders Placed This Encounter  Procedures   CT ANGIO ABDOMEN W &/OR WO CONTRAST   TSH   Basic metabolic panel   VAS US CAROTID    Meds ordered this encounter  Medications   spironolactone (ALDACTONE) 25 MG tablet    Sig: Take 1 tablet (25 mg total) by mouth daily.    Dispense:  90 tablet    Refill:  3    D/C DILTIAZEM AND KCL   amLODipine (NORVASC) 10 MG tablet    Sig: Take 1 tablet (10 mg total) by mouth daily.    Dispense:  90 tablet    Refill:  3    D/C DILTIAZEM AND KCL    I,Mykaella Javier,acting as a scribe for Chilton Si, MD.,have documented all relevant documentation on the behalf of Chilton Si, MD,as directed  by  Chilton Si, MD while in the presence of Chilton Si, MD.  I, Alanda Colton C. Duke Salvia, MD have reviewed all documentation for this visit.  The documentation of the exam, diagnosis, procedures, and orders on 01/22/2021 are all accurate and complete.   Signed, Chilton Si, MD  01/22/2021 5:17 PM    [facility]

## 2021-01-22 NOTE — Patient Instructions (Addendum)
Medication Instructions:  STOP POTASSIUM   STOP DILTIAZEM   START SPIRONOLACTONE 25 MG DAILY   START AMLODIPINE 10 MG DAILY    Labwork: BMET/TSH 1 WEEK    Testing/Procedures: Non-Cardiac CT scanning, (CAT scanning), is a noninvasive, special x-ray that produces cross-sectional images of the body using x-rays and a computer. CT scans help physicians diagnose and treat medical conditions. For some CT exams, a contrast material is used to enhance visibility in the area of the body being studied. CT scans provide greater clarity and reveal more details than regular x-ray exams. OF ABDOMEN   Your physician has requested that you have a carotid duplex. This test is an ultrasound of the carotid arteries in your neck. It looks at blood flow through these arteries that supply the brain with blood. Allow one hour for this exam. There are no restrictions or special instructions.   Follow-Up: 02/22/2021 10:00 AM WITH PHARM D AT [facility]   Special Instructions:   MONITOR YOUR BLOOD PRESSURE TWICE A DAY, LOG IN THE BOOK PROVIDED. BRING THE BOOK AND YOUR BLOOD PRESSURE MACHINE TO YOUR FOLLOW UP IN 1 MONTH    DASH Eating Plan DASH stands for "Dietary Approaches to Stop Hypertension." The DASH eating plan is a healthy eating plan that has been shown to reduce high blood pressure (hypertension). It may also reduce your risk for type 2 diabetes, heart disease, and stroke. The DASH eating plan may also help with weight loss. What are tips for following this plan?  General guidelines Avoid eating more than 2,300 mg (milligrams) of salt (sodium) a day. If you have hypertension, you may need to reduce your sodium intake to 1,500 mg a day. Limit alcohol intake to no more than 1 drink a day for nonpregnant women and 2 drinks a day for men. One drink equals 12 oz of beer, 5 oz of wine, or 1 oz of hard liquor. Work with your health care provider to maintain a healthy body weight or to lose weight.  Ask what an ideal weight is for you. Get at least 30 minutes of exercise that causes your heart to beat faster (aerobic exercise) most days of the week. Activities may include walking, swimming, or biking. Work with your health care provider or diet and nutrition specialist (dietitian) to adjust your eating plan to your individual calorie needs. Reading food labels  Check food labels for the amount of sodium per serving. Choose foods with less than 5 percent of the Daily Value of sodium. Generally, foods with less than 300 mg of sodium per serving fit into this eating plan. To find whole grains, look for the word "whole" as the first word in the ingredient list. Shopping Buy products labeled as "low-sodium" or "no salt added." Buy fresh foods. Avoid canned foods and premade or frozen meals. Cooking Avoid adding salt when cooking. Use salt-free seasonings or herbs instead of table salt or sea salt. Check with your health care provider or pharmacist before using salt substitutes. Do not fry foods. Cook foods using healthy methods such as baking, boiling, grilling, and broiling instead. Cook with heart-healthy oils, such as olive, canola, soybean, or sunflower oil. Meal planning Eat a balanced diet that includes: 5 or more servings of fruits and vegetables each day. At each meal, try to fill half of your plate with fruits and vegetables. Up to 6-8 servings of whole grains each day. Less than 6 oz of lean meat, poultry, or fish each day. A 3-oz  serving of meat is about the same size as a deck of cards. One egg equals 1 oz. 2 servings of low-fat dairy each day. A serving of nuts, seeds, or beans 5 times each week. Heart-healthy fats. Healthy fats called Omega-3 fatty acids are found in foods such as flaxseeds and coldwater fish, like sardines, salmon, and mackerel. Limit how much you eat of the following: Canned or prepackaged foods. Food that is high in trans fat, such as fried foods. Food that  is high in saturated fat, such as fatty meat. Sweets, desserts, sugary drinks, and other foods with added sugar. Full-fat dairy products. Do not salt foods before eating. Try to eat at least 2 vegetarian meals each week. Eat more home-cooked food and less restaurant, buffet, and fast food. When eating at a restaurant, ask that your food be prepared with less salt or no salt, if possible. What foods are recommended? The items listed may not be a complete list. Talk with your dietitian about what dietary choices are best for you. Grains Whole-grain or whole-wheat bread. Whole-grain or whole-wheat pasta. Brown rice. Orpah Cobb. Bulgur. Whole-grain and low-sodium cereals. Pita bread. Low-fat, low-sodium crackers. Whole-wheat flour tortillas. Vegetables Fresh or frozen vegetables (raw, steamed, roasted, or grilled). Low-sodium or reduced-sodium tomato and vegetable juice. Low-sodium or reduced-sodium tomato sauce and tomato paste. Low-sodium or reduced-sodium canned vegetables. Fruits All fresh, dried, or frozen fruit. Canned fruit in natural juice (without added sugar). Meat and other protein foods Skinless chicken or [place]. Ground chicken or [place]. Pork with fat trimmed off. Fish and seafood. Egg whites. Dried beans, peas, or lentils. Unsalted nuts, nut butters, and seeds. Unsalted canned beans. Lean cuts of beef with fat trimmed off. Low-sodium, lean deli meat. Dairy Low-fat (1%) or fat-free (skim) milk. Fat-free, low-fat, or reduced-fat cheeses. Nonfat, low-sodium ricotta or cottage cheese. Low-fat or nonfat yogurt. Low-fat, low-sodium cheese. Fats and oils Soft margarine without trans fats. Vegetable oil. Low-fat, reduced-fat, or light mayonnaise and salad dressings (reduced-sodium). Canola, safflower, olive, soybean, and sunflower oils. Avocado. Seasoning and other foods Herbs. Spices. Seasoning mixes without salt. Unsalted popcorn and pretzels. Fat-free sweets. What foods are not  recommended? The items listed may not be a complete list. Talk with your dietitian about what dietary choices are best for you. Grains Baked goods made with fat, such as croissants, muffins, or some breads. Dry pasta or rice meal packs. Vegetables Creamed or fried vegetables. Vegetables in a cheese sauce. Regular canned vegetables (not low-sodium or reduced-sodium). Regular canned tomato sauce and paste (not low-sodium or reduced-sodium). Regular tomato and vegetable juice (not low-sodium or reduced-sodium). Rosita Fire. Olives. Fruits Canned fruit in a light or heavy syrup. Fried fruit. Fruit in cream or butter sauce. Meat and other protein foods Fatty cuts of meat. Ribs. Fried meat. Tomasa Blase. Sausage. Bologna and other processed lunch meats. Salami. Fatback. Hotdogs. Bratwurst. Salted nuts and seeds. Canned beans with added salt. Canned or smoked fish. Whole eggs or egg yolks. Chicken or [place] with skin. Dairy Whole or 2% milk, cream, and half-and-half. Whole or full-fat cream cheese. Whole-fat or sweetened yogurt. Full-fat cheese. Nondairy creamers. Whipped toppings. Processed cheese and cheese spreads. Fats and oils Butter. Stick margarine. Lard. Shortening. Ghee. Bacon fat. Tropical oils, such as coconut, palm kernel, or palm oil. Seasoning and other foods Salted popcorn and pretzels. Onion salt, garlic salt, seasoned salt, table salt, and sea salt. Worcestershire sauce. Tartar sauce. Barbecue sauce. Teriyaki sauce. Soy sauce, including reduced-sodium. Steak sauce. Canned and packaged gravies.  Fish sauce. Oyster sauce. Cocktail sauce. Horseradish that you find on the shelf. Ketchup. Mustard. Meat flavorings and tenderizers. Bouillon cubes. Hot sauce and Tabasco sauce. Premade or packaged marinades. Premade or packaged taco seasonings. Relishes. Regular salad dressings. Where to find more information: National Heart, Lung, and Blood Institute: PopSteam.is American Heart Association:  www.heart.org Summary The DASH eating plan is a healthy eating plan that has been shown to reduce high blood pressure (hypertension). It may also reduce your risk for type 2 diabetes, heart disease, and stroke. With the DASH eating plan, you should limit salt (sodium) intake to 2,300 mg a day. If you have hypertension, you may need to reduce your sodium intake to 1,500 mg a day. When on the DASH eating plan, aim to eat more fresh fruits and vegetables, whole grains, lean proteins, low-fat dairy, and heart-healthy fats. Work with your health care provider or diet and nutrition specialist (dietitian) to adjust your eating plan to your individual calorie needs. This information is not intended to replace advice given to you by your health care provider. Make sure you discuss any questions you have with your health care provider. Document Released: 02/13/2011 Document Revised: 02/06/2017 Document Reviewed: 02/18/2016 Elsevier Patient Education  2020 [employer].

## 2021-01-22 NOTE — Assessment & Plan Note (Addendum)
Her blood pressure is extremely elevated despite being on multiple medications.  On review of her prior imaging she had a CT of the abdomen that was concerning for an adenoma.  There is no contrast with this study.  We will get a CTA of the abdomen to assess for adrenal adenoma as well as patency of her renal arteries.  Given her body habitus I am concerned that renal artery Dopplers would be inconclusive.  She is also had hypokalemia.  We will start spironolactone 25 mg daily.  Continue telmisartan, metoprolol, and hydrochlorothiazide.  Stop diltiazem and switch to amlodipine.  Ultimately, she should have renal and aldosterone checked.  However we cannot do this while she is on olmesartan and her blood pressure is too poorly controlled to stop any agents at this time.  We did discuss the PREP program through the [facility]  However she is unable to participate at this time due to severe knee pain.  We will consider this after she has surgery.  She is interested in our remote patient monitoring study and consents to monitoring through the [employer] remote patient monitoring system.  We also discussed limiting NSAID use.

## 2021-01-22 NOTE — Assessment & Plan Note (Signed)
Mild aortic stenosis noted on echo in 2015.  She is completely asymptomatic and her murmur is very quiet and early peaking.  I do not suspect clinically significant aortic stenosis.  Consider repeating a baseline echo in the future.  Mean gradient was 12 mmHg.

## 2021-01-22 NOTE — Research (Signed)
[name] [name] met criteria for the virtual care and social determinant interventions for the management of hypertension trial. The study was discussed with her by Dr. Oval Linsey and myself including risks/benefits. She had the opportunity to read the consent and ask questions. The consent was signed and she was given a copy. No study related procedures were performed prior to consent being signed.  [name] [name] was randomized to Group 1: advanced hypertension clinic.

## 2021-01-23 ENCOUNTER — Telehealth (HOSPITAL_BASED_OUTPATIENT_CLINIC_OR_DEPARTMENT_OTHER): Payer: Self-pay | Admitting: Cardiovascular Disease

## 2021-01-23 NOTE — Telephone Encounter (Signed)
Left message for patient to call and discuss scheduling the CTA abdomen ordered by Dr. Duke Salvia

## 2021-01-23 NOTE — Telephone Encounter (Signed)
Spoke with patient regarding the 01/30/21 10:00 am CTA abdomen scheduled at [facility]--arrival time is 9:45 am for check in ---liquids only 4 hour prior to exam---patient to come in 01/24/21 or 01/25/21 for labs.  She voiced her understanding.

## 2021-01-24 ENCOUNTER — Telehealth (HOSPITAL_BASED_OUTPATIENT_CLINIC_OR_DEPARTMENT_OTHER): Payer: Self-pay | Admitting: *Deleted

## 2021-01-24 DIAGNOSIS — I10 Essential (primary) hypertension: Secondary | ICD-10-CM | POA: Diagnosis not present

## 2021-01-24 DIAGNOSIS — Z5181 Encounter for therapeutic drug level monitoring: Secondary | ICD-10-CM | POA: Diagnosis not present

## 2021-01-24 NOTE — Telephone Encounter (Addendum)
Patient walk into office today, stated that when she took her blood pressure this morning it was 242/112 which makes her very nervous, I asked if that was before or after she took her morning blood pressure meds and she stated it was 5 mins after taking her medicine, blood pressure was recheck in office 155/79 and compare to pt blood pressure machine which was 150/79, pt have a f/u appt with pharmD on 12/16, any recommendation for pt?  11/16- 180/97 67  11:30 am 11/16- 172/96 73   6:00 pm 11/17- 169/121 69  8:15 am

## 2021-01-25 LAB — BASIC METABOLIC PANEL
BUN/Creatinine Ratio: 22 (ref 12–28)
BUN: 18 mg/dL (ref 8–27)
CO2: 27 mmol/L (ref 20–29)
Calcium: 9.5 mg/dL (ref 8.7–10.3)
Chloride: 97 mmol/L (ref 96–106)
Creatinine, Ser: 0.83 mg/dL (ref 0.57–1.00)
Glucose: 167 mg/dL — ABNORMAL HIGH (ref 70–99)
Potassium: 3.7 mmol/L (ref 3.5–5.2)
Sodium: 141 mmol/L (ref 134–144)
eGFR: 75 mL/min/{1.73_m2} (ref >59)

## 2021-01-25 LAB — TSH: TSH: 2.7 u[IU]/mL (ref 0.450–4.500)

## 2021-01-29 NOTE — Telephone Encounter (Signed)
January 29, 2021 Chilton Si, MD to Elenora Gamma, [place], RPH-CPP  Me   5:59 AM Switch HCTZ to chlorthalidone 25mg .  Switch metoprolol to carvedilol 25mg  bid.  Repeat bmp in a week.   TCR   Left message to call back

## 2021-01-30 ENCOUNTER — Other Ambulatory Visit: Payer: Self-pay

## 2021-01-30 ENCOUNTER — Encounter (HOSPITAL_BASED_OUTPATIENT_CLINIC_OR_DEPARTMENT_OTHER): Payer: Self-pay

## 2021-01-30 ENCOUNTER — Ambulatory Visit (HOSPITAL_BASED_OUTPATIENT_CLINIC_OR_DEPARTMENT_OTHER)
Admission: RE | Admit: 2021-01-30 | Discharge: 2021-01-30 | Disposition: A | Discharge status: Home / Self Care | Payer: Medicare HMO | Source: Ambulatory Visit | Attending: Cardiovascular Disease | Admitting: Cardiovascular Disease

## 2021-01-30 DIAGNOSIS — N281 Cyst of kidney, acquired: Secondary | ICD-10-CM | POA: Diagnosis not present

## 2021-01-30 DIAGNOSIS — I10 Essential (primary) hypertension: Secondary | ICD-10-CM | POA: Insufficient documentation

## 2021-01-30 MED ORDER — CHLORTHALIDONE 25 MG PO TABS: 25.0000 mg | ORAL_TABLET | Freq: Every day | ORAL | 3 refills | Status: DC

## 2021-01-30 MED ORDER — IOHEXOL 350 MG/ML SOLN
75.0000 mL | Freq: Once | INTRAVENOUS | Status: AC | PRN
  Administered 2021-01-30: 75 mL via INTRAVENOUS

## 2021-01-30 MED ORDER — CARVEDILOL 25 MG PO TABS: 25.0000 mg | ORAL_TABLET | Freq: Two times a day (BID) | ORAL | 3 refills | Status: DC

## 2021-01-30 NOTE — Telephone Encounter (Signed)
Spoke with pt, aware of dr Leonides Sake recommendations. New script sent to the pharmacy  Lab orders mailed to the pt

## 2021-01-30 NOTE — Telephone Encounter (Signed)
Pt is returning call.  

## 2021-02-05 ENCOUNTER — Ambulatory Visit (HOSPITAL_BASED_OUTPATIENT_CLINIC_OR_DEPARTMENT_OTHER): Payer: Medicare HMO

## 2021-02-07 ENCOUNTER — Other Ambulatory Visit: Payer: Self-pay

## 2021-02-07 ENCOUNTER — Ambulatory Visit (INDEPENDENT_AMBULATORY_CARE_PROVIDER_SITE_OTHER): Payer: Medicare HMO

## 2021-02-07 DIAGNOSIS — I10 Essential (primary) hypertension: Secondary | ICD-10-CM | POA: Diagnosis not present

## 2021-02-07 DIAGNOSIS — R0989 Other specified symptoms and signs involving the circulatory and respiratory systems: Secondary | ICD-10-CM

## 2021-02-07 LAB — BASIC METABOLIC PANEL
BUN/Creatinine Ratio: 31 — ABNORMAL HIGH (ref 12–28)
BUN: 31 mg/dL — ABNORMAL HIGH (ref 8–27)
CO2: 27 mmol/L (ref 20–29)
Calcium: 9.4 mg/dL (ref 8.7–10.3)
Chloride: 100 mmol/L (ref 96–106)
Creatinine, Ser: 1 mg/dL (ref 0.57–1.00)
Glucose: 159 mg/dL — ABNORMAL HIGH (ref 70–99)
Potassium: 4.1 mmol/L (ref 3.5–5.2)
Sodium: 140 mmol/L (ref 134–144)
eGFR: 60 mL/min/{1.73_m2} (ref >59)

## 2021-02-21 NOTE — Progress Notes (Signed)
02/22/2021 Delrae Sawyers Gutter March 12, 1950 [id number]   HPI:  [name] [name] is a 70 y.o. female patient of Dr Duke Salvia, with a PMH below who presents today for advanced hypertension clinic follow up.  She was originally seen last month, with a BP of 193/81 and agreed to join our [employer].  She was randomized to group 1, to have monthly PharmD visits x 3.   Patient reported that she developed hypertension after her first pregnancy, and has always been difficult to control.  She also notes that she has been denied a much needed knee surgery because of uncontrolled hypertension.   A recent abdominal CT showed some thickening of the left adrenal gland, and she will hold olmesartan and spironolactone so that renin/aldosterone labs can be drawn.  There was no indication of RAS or fibromuscular dysplasia.  Today she returns for follow up.  She notes problems with chronic pain in her back, making it hard to stand long enough to complete basic home chores.  Also has bad knees and is wanting to get cortisone injections from Dr. Luiz Blare, but has to show that her BP is better first.  States that the oxycodone/apap 7.5 tablets (even taking 2 tabs) give no relief.  A recent CT of the   Past Medical History: hyperlipidemia 8/22 LDL 120 on no current cholesterol meds  Pre-diabetes 8/22 A1c 6.4 - on metformin     Blood Pressure Goal:  130/80  Current Medications: olmesartan 40 mg qd, chlorthalidone 25 mg qd, amlodipine 10 mg qd, spironolactone 25 mg qd, carvedilol 25 mg bid  Family Hx: notes hypertension in both parents and all siblings, but only hers has been hard to manage, children w/o hypertension  Social Hx: no tobacco, no alcohol, no regualr caffeine  Diet: mix of home, eating out; hard to stand for long periods so not able to cook much, no added salt; mix of meats, not many vegetables recently  Exercise: unable d/t knees and back  Home BP readings:   27 AM readings  average 137/80 HR 66  (range 118-180/58-121)  34 PM readings average 140/81 HR 66  (range 112-170/63-104)  Intolerances: nkda  Labs: 02/07/21  Na 140, K 4.1, Glu 159, BUN 31, SCr 1.0, GFR 60   Wt Readings from Last 3 Encounters:  02/22/21 235 lb 0.2 oz (106.6 kg)  01/22/21 243 lb (110.2 kg)  01/01/18 198 lb 14.4 oz (90.2 kg)   BP Readings from Last 3 Encounters:  02/22/21 110/68  01/22/21 (!) 193/81  07/02/20 (!) 158/108   Pulse Readings from Last 3 Encounters:  02/22/21 (!) 54  01/22/21 62  07/02/20 92    Current Outpatient Medications  Medication Sig Dispense Refill   amLODipine (NORVASC) 10 MG tablet Take 1 tablet (10 mg total) by mouth daily. 90 tablet 3   carvedilol (COREG) 25 MG tablet Take 1 tablet (25 mg total) by mouth 2 (two) times daily. 180 tablet 3   chlorthalidone (HYGROTON) 25 MG tablet Take 1 tablet (25 mg total) by mouth daily. 90 tablet 3   docusate sodium (COLACE) 100 MG capsule Take 1 capsule (100 mg total) by mouth 2 (two) times daily as needed for mild constipation. (Patient taking differently: Take 200 mg by mouth at bedtime as needed (constipation).) 30 capsule 1   furosemide (LASIX) 20 MG tablet Take 20 mg by mouth daily as needed for fluid.     gabapentin (NEURONTIN) 300 MG capsule Take 2 capsules (600 mg  total) by mouth 3 (three) times daily. 90 capsule 2   ibuprofen (ADVIL,MOTRIN) 200 MG tablet Take 3 tablets (600 mg total) by mouth at bedtime as needed for moderate pain. Resume 5 days post-op as needed (Patient taking differently: Take 400-600 mg by mouth every 8 (eight) hours as needed for moderate pain. Resume 5 days post-op as needed) 30 tablet 0   olmesartan (BENICAR) 40 MG tablet Take 1 tablet by mouth daily.     oxyCODONE-acetaminophen (PERCOCET) 7.5-325 MG tablet Take 1 tablet by mouth every 4 (four) hours as needed for severe pain. 60 tablet 0   spironolactone (ALDACTONE) 25 MG tablet Take 1 tablet (25 mg total) by mouth daily. 90 tablet 3    traMADol (ULTRAM) 50 MG tablet Take 50-100 mg by mouth every 6 (six) hours as needed.     No current facility-administered medications for this visit.    No Known Allergies  Past Medical History:  Diagnosis Date   Abdominal pain    Aortic stenosis 01/22/2021   Arthritis    Carotid bruit 01/22/2021   Complication of anesthesia    Hard to wake up.   Heart murmur    Hypercholesteremia    Hypertension    Left leg DVT (HCC) 06/2008   s/p knee surgery   Left leg pain    Pneumonia    Prediabetes    Resistant hypertension 01/22/2021    Blood pressure 110/68, pulse (!) 54, height 5\' 6"  (1.676 m), weight 235 lb 0.2 oz (106.6 kg).  Resistant hypertension Patient with resistant hypertension, now doing better on combination of 5 medications.  A recent CT showed her left adrenal gland to have some "thickening".  Will now order aldosterone/renin labs to check for hyperaldosteronism.  She will need to come off the spironolactone and olmesartan for 3 days, then get labs drawn early Tuesday morning.  Restart meds once blood is drawn.  Patient is asking for Monday to notify Dr. [place] at [facility] if it is safe for her to get cortisone injections in her knees.  Will forward to [facility] for this.  Patient should continue monitoring home BP readings and will return in a month for follow up.     Duke Salvia PharmD CPP [facility] [address] [place], [place] [phone]

## 2021-02-22 ENCOUNTER — Encounter (HOSPITAL_BASED_OUTPATIENT_CLINIC_OR_DEPARTMENT_OTHER): Payer: Self-pay | Admitting: Pharmacist Clinician (PhC)/ Clinical Pharmacy Specialist

## 2021-02-22 ENCOUNTER — Other Ambulatory Visit: Payer: Self-pay

## 2021-02-22 ENCOUNTER — Ambulatory Visit (HOSPITAL_BASED_OUTPATIENT_CLINIC_OR_DEPARTMENT_OTHER): Payer: Medicare HMO | Admitting: Pharmacist Clinician (PhC)/ Clinical Pharmacy Specialist

## 2021-02-22 VITALS — BP 110/68 | HR 54 | Ht 66.0 in | Wt 235.0 lb

## 2021-02-22 DIAGNOSIS — I10 Essential (primary) hypertension: Secondary | ICD-10-CM

## 2021-02-22 NOTE — Assessment & Plan Note (Signed)
Patient with resistant hypertension, now doing better on combination of 5 medications.  A recent CT showed her left adrenal gland to have some "thickening".  Will now order aldosterone/renin labs to check for hyperaldosteronism.  She will need to come off the spironolactone and olmesartan for 3 days, then get labs drawn early Tuesday morning.  Restart meds once blood is drawn.  Patient is asking for [place] to notify Dr. Luiz Blare at [facility] if it is safe for her to get cortisone injections in her knees.  Will forward to Dr. Duke Salvia for this.  Patient should continue monitoring home BP readings and will return in a month for follow up.

## 2021-02-22 NOTE — Patient Instructions (Signed)
Return for a a follow up appointment Friday January 13 at 9 am  Go to the lab Tuesday first thing in the morning  Check your blood pressure at home daily and keep record of the readings.  Take your BP meds as follows:  STOP spironolactone and olmesartan for 3 days (Saturday, Sunday and Monday)  Go to the lab on Tuesday morning for blood work  Restart spironolactone and olmesartan Tuesday morning after getting blood draw  Bring all of your meds, your BP cuff and your record of home blood pressures to your next appointment.  Exercise as youre able, try to walk approximately 30 minutes per day.  Keep salt intake to a minimum, especially watch canned and prepared boxed foods.  Eat more fresh fruits and vegetables and fewer canned items.  Avoid eating in fast food restaurants.    HOW TO TAKE YOUR BLOOD PRESSURE: Rest 5 minutes before taking your blood pressure.  Dont smoke or drink caffeinated beverages for at least 30 minutes before. Take your blood pressure before (not after) you eat. Sit comfortably with your back supported and both feet on the floor (dont cross your legs). Elevate your arm to heart level on a table or a desk. Use the proper sized cuff. It should fit smoothly and snugly around your bare upper arm. There should be enough room to slip a fingertip under the cuff. The bottom edge of the cuff should be 1 inch above the crease of the elbow. Ideally, take 3 measurements at one sitting and record the average.

## 2021-02-26 DIAGNOSIS — I10 Essential (primary) hypertension: Secondary | ICD-10-CM | POA: Diagnosis not present

## 2021-02-27 ENCOUNTER — Encounter (HOSPITAL_BASED_OUTPATIENT_CLINIC_OR_DEPARTMENT_OTHER): Payer: Self-pay

## 2021-03-06 ENCOUNTER — Telehealth (HOSPITAL_BASED_OUTPATIENT_CLINIC_OR_DEPARTMENT_OTHER): Payer: Self-pay | Admitting: *Deleted

## 2021-03-06 LAB — ALDOSTERONE + RENIN ACTIVITY W/ RATIO
ALDOS/RENIN RATIO: 9.4 (ref 0.0–30.0)
ALDOSTERONE: 6.4 ng/dL (ref 0.0–30.0)
Renin: 0.68 ng/mL/hr (ref 0.167–5.380)

## 2021-03-06 NOTE — Telephone Encounter (Signed)
Chilton Si, MD  Rosalee Kaufman, RPH-CPP; Hillis Range, MD; Regis Bill B, LPN I think it is OK for her to get a cortisone injection.   TCR        Previous Messages   ----- Message -----  From: Rosalee Kaufman, RPH-CPP  Sent: 02/22/2021  12:37 PM EST  To: Hillis Range, MD, Chilton Si, MD   Dr. Duke Salvia - pt would like to have cortisone injections, needs to have our office give permission.  They've                  been holding d/t elevated BP.  Last reading > 150 systolic was 11/24     Advised patient and sent to Dr Jodi Geralds at [facility]

## 2021-03-06 NOTE — Progress Notes (Signed)
Called patient and sent to Dr Jodi Geralds

## 2021-03-18 DIAGNOSIS — M1711 Unilateral primary osteoarthritis, right knee: Secondary | ICD-10-CM | POA: Diagnosis not present

## 2021-03-18 DIAGNOSIS — M25562 Pain in left knee: Secondary | ICD-10-CM | POA: Diagnosis not present

## 2021-03-18 DIAGNOSIS — M1712 Unilateral primary osteoarthritis, left knee: Secondary | ICD-10-CM | POA: Diagnosis not present

## 2021-03-19 ENCOUNTER — Telehealth: Payer: Self-pay | Admitting: Cardiovascular Disease

## 2021-03-19 NOTE — Telephone Encounter (Signed)
° °  Primary Cardiologist: Chilton Si, MD  Chart reviewed as part of pre-operative protocol coverage. Given past medical history and time since last visit, based on ACC/AHA guidelines, [name] [name] would be at acceptable risk for the planned procedure without further cardiovascular testing.   Her RCRI is a class II risk, 0.9% risk of major cardiac event.  I will route this recommendation to the requesting party via Epic fax function and remove from pre-op pool.  Please call with questions.  Thomasene Ripple. Milaya Hora NP-C    03/19/2021, 11:38 AM [facility] [address] Office [phone] Fax [phone]

## 2021-03-19 NOTE — Telephone Encounter (Signed)
° °  Pre-operative Risk Assessment    Patient Name: [name] [name]  DOB: [birth year] MRN: [id number]      Request for Surgical Clearance    Procedure:   Right Knee Orthoscopy  Date of Surgery:  Clearance 03/20/21                                 Surgeon:  Dr. Luiz Blare  Surgeon's Group or Practice Name:  [facility]  Phone number:  [phone] Fax number:  [phone]   Type of Clearance Requested:   - Medical  - Pharmacy:  Hold    TBD by Cardiology    Type of Anesthesia:  General    Additional requests/questions:  Please fax a copy of all recent OV notes confirming HTN has been addressed to the surgeon's office.   Please fax as urgent to ATTN:Judy so she can give to anesthesia today to keep surgery scheduled  Signed, Brooks Sailors   03/19/2021, 11:20 AM

## 2021-03-20 DIAGNOSIS — S83271A Complex tear of lateral meniscus, current injury, right knee, initial encounter: Secondary | ICD-10-CM | POA: Diagnosis not present

## 2021-03-20 DIAGNOSIS — G8918 Other acute postprocedural pain: Secondary | ICD-10-CM | POA: Diagnosis not present

## 2021-03-20 DIAGNOSIS — S83231A Complex tear of medial meniscus, current injury, right knee, initial encounter: Secondary | ICD-10-CM | POA: Diagnosis not present

## 2021-03-20 DIAGNOSIS — M94261 Chondromalacia, right knee: Secondary | ICD-10-CM | POA: Diagnosis not present

## 2021-03-20 DIAGNOSIS — M6751 Plica syndrome, right knee: Secondary | ICD-10-CM | POA: Diagnosis not present

## 2021-03-22 ENCOUNTER — Other Ambulatory Visit: Payer: Self-pay

## 2021-03-22 ENCOUNTER — Encounter (HOSPITAL_BASED_OUTPATIENT_CLINIC_OR_DEPARTMENT_OTHER): Payer: Self-pay | Admitting: Pharmacist Clinician (PhC)/ Clinical Pharmacy Specialist

## 2021-03-22 ENCOUNTER — Ambulatory Visit (HOSPITAL_BASED_OUTPATIENT_CLINIC_OR_DEPARTMENT_OTHER): Payer: Medicare Other | Admitting: Pharmacist Clinician (PhC)/ Clinical Pharmacy Specialist

## 2021-03-22 DIAGNOSIS — I10 Essential (primary) hypertension: Secondary | ICD-10-CM

## 2021-03-22 NOTE — Assessment & Plan Note (Signed)
Patient with resistant hypertension, now doing much better on combination of 5 medications.  Blood pressure has improved despite ongoing knee and back pain.  Will have her continue with current regimen and we will see her back in 1 month for follow up.

## 2021-03-22 NOTE — Progress Notes (Signed)
03/22/2021 Kennedy 1951/02/08 [id number]   HPI:  [name] [name] is a 71 y.o. female patient of Dr Oval Linsey, with a [place] below who presents today for advanced hypertension clinic follow up.  She was originally seen last month, with a BP of 193/81 and agreed to join our [employer].  She was randomized to group 1, to have monthly PharmD visits x 3.   Patient reported that she developed hypertension after her first pregnancy, and has always been difficult to control.  She also notes that she has been denied a much needed knee surgery because of uncontrolled hypertension.   A recent abdominal CT showed some thickening of the left adrenal gland, and she will hold olmesartan and spironolactone so that renin/aldosterone labs can be drawn.  There was no indication of RAS or fibromuscular dysplasia.  At  her last visit pressure was well controlled at 110/68 and other than back and knee problems, had no complaints.  She was hoping to get a cortisone injection in her knee in the near future.  Medications were left unchanged.  Today she returns for follow up.   She had orthoscopic knee surgery on Wednesday on her right knee and is hoping to get replacement surgery on the left soon.  She is in a wheelchair today, as she is not yet putting weight on the right knee.    Past Medical History: hyperlipidemia 8/22 LDL 120 on no current cholesterol meds  Pre-diabetes 8/22 A1c 6.4 - on metformin     Blood Pressure Goal:  130/80  Current Medications: olmesartan 40 mg qd, chlorthalidone 25 mg qd, amlodipine 10 mg qd, spironolactone 25 mg qd, carvedilol 25 mg bid  Family Hx: notes hypertension in both parents and all siblings, but only hers has been hard to manage, children w/o hypertension  Social Hx: no tobacco, no alcohol, no regualr caffeine  Diet: mix of home, eating out; hard to stand for long periods so not able to cook much, no added salt; mix of meats, not many  vegetables recently  Exercise: unable d/t knees and back, had knee surgery Wednesday  Home BP readings:   AM 12 readings average 130/75; previous average 137/80  PM 11 readings average 127/73; previous average 140/81  Intolerances: nkda  Labs: 02/07/21  Na 140, K 4.1, Glu 159, BUN 31, SCr 1.0, GFR 60   Wt Readings from Last 3 Encounters:  03/22/21 235 lb (106.6 kg)  02/22/21 235 lb 0.2 oz (106.6 kg)  01/22/21 243 lb (110.2 kg)   BP Readings from Last 3 Encounters:  03/22/21 137/77  02/22/21 110/68  01/22/21 (!) 193/81   Pulse Readings from Last 3 Encounters:  03/22/21 (!) 53  02/22/21 (!) 54  01/22/21 62    Current Outpatient Medications  Medication Sig Dispense Refill   amLODipine (NORVASC) 10 MG tablet Take 1 tablet (10 mg total) by mouth daily. 90 tablet 3   carvedilol (COREG) 25 MG tablet Take 1 tablet (25 mg total) by mouth 2 (two) times daily. 180 tablet 3   chlorthalidone (HYGROTON) 25 MG tablet Take 1 tablet (25 mg total) by mouth daily. 90 tablet 3   docusate sodium (COLACE) 100 MG capsule Take 1 capsule (100 mg total) by mouth 2 (two) times daily as needed for mild constipation. (Patient taking differently: Take 200 mg by mouth at bedtime as needed (constipation).) 30 capsule 1   furosemide (LASIX) 20 MG tablet Take 20 mg by mouth daily as  needed for fluid.     gabapentin (NEURONTIN) 300 MG capsule Take 2 capsules (600 mg total) by mouth 3 (three) times daily. 90 capsule 2   HYDROcodone-acetaminophen (NORCO/VICODIN) 5-325 MG tablet Take 1 tablet by mouth every 6 (six) hours as needed for moderate pain.     olmesartan (BENICAR) 40 MG tablet Take 1 tablet by mouth daily.     spironolactone (ALDACTONE) 25 MG tablet Take 1 tablet (25 mg total) by mouth daily. 90 tablet 3   traMADol (ULTRAM) 50 MG tablet Take 50-100 mg by mouth every 6 (six) hours as needed.     ibuprofen (ADVIL,MOTRIN) 200 MG tablet Take 3 tablets (600 mg total) by mouth at bedtime as needed for moderate  pain. Resume 5 days post-op as needed (Patient not taking: Reported on 03/22/2021) 30 tablet 0   No current facility-administered medications for this visit.    No Known Allergies  Past Medical History:  Diagnosis Date   Abdominal pain    Aortic stenosis 01/22/2021   Arthritis    Carotid bruit [id number]   Complication of anesthesia    Hard to wake up.   Heart murmur    Hypercholesteremia    Hypertension    Left leg DVT ([facility]) 06/2008   s/p knee surgery   Left leg pain    Pneumonia    Prediabetes    Resistant hypertension 01/22/2021    Blood pressure 137/77, pulse (!) 53, height 5\' 6"  (1.676 m), weight 235 lb (106.6 kg).  Resistant hypertension Patient with resistant hypertension, now doing much better on combination of 5 medications.  Blood pressure has improved despite ongoing knee and back pain.  Will have her continue with current regimen and we will see her back in 1 month for follow up.    Tommy Medal PharmD CPP [facility] [address] [place], [place] [address] [phone]

## 2021-03-22 NOTE — Patient Instructions (Signed)
Return for a a follow up appointment February 10 at 9 am  Check your blood pressure at home daily and keep record of the readings.  Take your BP meds as follows:  Continue with current medications  Bring all of your meds, your BP cuff and your record of home blood pressures to your next appointment.  Exercise as youre able, try to walk approximately 30 minutes per day.  Keep salt intake to a minimum, especially watch canned and prepared boxed foods.  Eat more fresh fruits and vegetables and fewer canned items.  Avoid eating in fast food restaurants.    HOW TO TAKE YOUR BLOOD PRESSURE: Rest 5 minutes before taking your blood pressure.  Dont smoke or drink caffeinated beverages for at least 30 minutes before. Take your blood pressure before (not after) you eat. Sit comfortably with your back supported and both feet on the floor (dont cross your legs). Elevate your arm to heart level on a table or a desk. Use the proper sized cuff. It should fit smoothly and snugly around your bare upper arm. There should be enough room to slip a fingertip under the cuff. The bottom edge of the cuff should be 1 inch above the crease of the elbow. Ideally, take 3 measurements at one sitting and record the average.

## 2021-03-25 DIAGNOSIS — I809 Phlebitis and thrombophlebitis of unspecified site: Secondary | ICD-10-CM | POA: Diagnosis not present

## 2021-03-25 DIAGNOSIS — N390 Urinary tract infection, site not specified: Secondary | ICD-10-CM | POA: Diagnosis not present

## 2021-03-26 DIAGNOSIS — I959 Hypotension, unspecified: Secondary | ICD-10-CM | POA: Diagnosis not present

## 2021-03-26 DIAGNOSIS — I809 Phlebitis and thrombophlebitis of unspecified site: Secondary | ICD-10-CM | POA: Diagnosis not present

## 2021-03-27 ENCOUNTER — Other Ambulatory Visit (HOSPITAL_BASED_OUTPATIENT_CLINIC_OR_DEPARTMENT_OTHER): Payer: Self-pay | Admitting: Family Medicine

## 2021-03-27 DIAGNOSIS — I809 Phlebitis and thrombophlebitis of unspecified site: Secondary | ICD-10-CM

## 2021-03-28 ENCOUNTER — Telehealth (HOSPITAL_BASED_OUTPATIENT_CLINIC_OR_DEPARTMENT_OTHER): Payer: Self-pay | Admitting: Cardiovascular Disease

## 2021-03-28 NOTE — Telephone Encounter (Signed)
Pt c/o BP issue: STAT if pt c/o blurred vision, one-sided weakness or slurred speech  1. What are your last 5 BP readings?  03/28/21: 126/59 03/27/21: 129/77 before medication 03/26/21: 99/76 at [facility]  2. Are you having any other symptoms (ex. Dizziness, headache, blurred vision, passed out)? fatigue  3. What is your BP issue? Daughter is concerned about the bottom number dropping. The patient has also stayed in bed with no energy, which is not like the patient

## 2021-03-28 NOTE — Telephone Encounter (Signed)
Returned patient call about blood pressure concerns.    Issues since Friday night with frequent urination- bladder infection per PCP . Patient reports Feeling weak and extremely fatigued. She was Advised by PCP if BP was > 110 only take a half tablet of amlodipine and if <110 not to take at all.   Patient called in concerned because this morning patient has not taken her medication at all and blood pressure is 126/59. Patient is concerned that her mother's blood pressure seems to be dropping even though she has not taken her medications and she because she feels so weak and tired.    Will route to Dr. Duke Salvia for further evaluation

## 2021-03-29 ENCOUNTER — Ambulatory Visit (HOSPITAL_BASED_OUTPATIENT_CLINIC_OR_DEPARTMENT_OTHER)
Admission: RE | Admit: 2021-03-29 | Discharge: 2021-03-29 | Disposition: A | Discharge status: Home / Self Care | Payer: Medicare Other | Source: Ambulatory Visit | Attending: Family Medicine | Admitting: Family Medicine

## 2021-03-29 ENCOUNTER — Other Ambulatory Visit: Payer: Self-pay

## 2021-03-29 DIAGNOSIS — I82611 Acute embolism and thrombosis of superficial veins of right upper extremity: Secondary | ICD-10-CM | POA: Diagnosis not present

## 2021-03-29 DIAGNOSIS — I809 Phlebitis and thrombophlebitis of unspecified site: Secondary | ICD-10-CM | POA: Diagnosis not present

## 2021-03-29 NOTE — Telephone Encounter (Signed)
Returned patient call with Dr. Leonides Sake recommendations! Patient states she is feeling much better today!

## 2021-03-29 NOTE — Telephone Encounter (Signed)
March 28, 2021 Chilton Si, MD to Marlene Lard, RN  Me     5:28 PM This should improve as her infection resolves.  Hold spironolactone, lasix and chlorthalidone if BP remains <130.  As her BP improves she will be able to add her medications back.   Lendon Collar RN advised patient

## 2021-04-01 DIAGNOSIS — I10 Essential (primary) hypertension: Secondary | ICD-10-CM | POA: Diagnosis not present

## 2021-04-04 ENCOUNTER — Encounter (HOSPITAL_BASED_OUTPATIENT_CLINIC_OR_DEPARTMENT_OTHER): Payer: Self-pay | Admitting: Cardiovascular Disease

## 2021-04-04 NOTE — Telephone Encounter (Signed)
Patient has resumed taking Spirolactone, Lasix, and Chlorthalidone- patient will hold per C. Walker    Patient to start keeping a more detailed log of her blood pressures so we can see if they are consistently staying low and if BP meds need to be further adjusted!

## 2021-04-04 NOTE — Telephone Encounter (Signed)
Please confirm whether patient has been taking spironolactone, Lasix, chlorthalidone.  She was previously told to hold if her blood pressure was less than 130.  If she has been taking this medications then she should hold them today.  If she has not been taking this medications, she should hold her olmesartan and amlodipine today.  Be helpful to have daily blood pressure logs from the patient with notation of which medication she is taking so we can further help her to adjust medications.  Alver Sorrow, NP

## 2021-04-08 DIAGNOSIS — I10 Essential (primary) hypertension: Secondary | ICD-10-CM | POA: Diagnosis not present

## 2021-04-19 ENCOUNTER — Ambulatory Visit (HOSPITAL_BASED_OUTPATIENT_CLINIC_OR_DEPARTMENT_OTHER): Payer: Medicare Other | Admitting: Pharmacist Clinician (PhC)/ Clinical Pharmacy Specialist

## 2021-04-19 ENCOUNTER — Encounter (HOSPITAL_BASED_OUTPATIENT_CLINIC_OR_DEPARTMENT_OTHER): Payer: Self-pay | Admitting: Pharmacist Clinician (PhC)/ Clinical Pharmacy Specialist

## 2021-04-19 ENCOUNTER — Other Ambulatory Visit: Payer: Self-pay

## 2021-04-19 DIAGNOSIS — I10 Essential (primary) hypertension: Secondary | ICD-10-CM | POA: Diagnosis not present

## 2021-04-19 NOTE — Patient Instructions (Signed)
Return for a a follow up appointment March 22 at 9 am with Dr. Duke Salvia  Check your blood pressure at home daily and keep record of the readings.  Take your BP meds as follows:  Restart chlorthalidone 25 mg each morning - start tomorrow  Move amlodipine 5 mg to evenings - start tonight  Take furosemide 20 mg daily for 2-3 days, then resume taking as needed.    AM: olmesartan 40 mg, chlorthalidone 25 mg, carvedilol 25 mg  PM: amlodipine 5 mg (1/2 tablet), carvedilol 25 mg   Bring all of your meds, your BP cuff and your record of home blood pressures to your next appointment.  Exercise as youre able, try to walk approximately 30 minutes per day.  Keep salt intake to a minimum, especially watch canned and prepared boxed foods.  Eat more fresh fruits and vegetables and fewer canned items.  Avoid eating in fast food restaurants.    HOW TO TAKE YOUR BLOOD PRESSURE: Rest 5 minutes before taking your blood pressure.  Dont smoke or drink caffeinated beverages for at least 30 minutes before. Take your blood pressure before (not after) you eat. Sit comfortably with your back supported and both feet on the floor (dont cross your legs). Elevate your arm to heart level on a table or a desk. Use the proper sized cuff. It should fit smoothly and snugly around your bare upper arm. There should be enough room to slip a fingertip under the cuff. The bottom edge of the cuff should be 1 inch above the crease of the elbow. Ideally, take 3 measurements at one sitting and record the average.

## 2021-04-19 NOTE — Progress Notes (Unsigned)
04/19/2021 [name] [name] 07/26/1950 [id number]   HPI:  [name] [name] is a 71 y.o. female patient of Dr Duke Salvia, with a PM[place] below who presents today for advanced hypertension clinic follow up.  She was originally seen last month, with a BP of 193/81 and agreed to join our [employer].  She was randomized to group 1, to have monthly PharmD visits x 3.   Patient reported that she developed hypertension after her first pregnancy, and has always been difficult to control.  She also notes that she has been denied a much needed knee surgery because of uncontrolled hypertension.   An abdominal CT showed some thickening of the left adrenal gland, and appropriate medications were held to draw aldosterone/renin labs. There was no indication of RAS or fibromuscular dysplasia.  She recently had knee surgery, but feels that it wasn't successful and is not walking today.  States has chronic pain from knees as well as back, and rates her daily pain level at about an 8/10.  Since her last visit, she has had a few days with hypotension.  She reached out to both our office and her family practitioner for advice.  Furosemide, spironolactone and chlorthalidone were all discontinued and the amlodipine was decreased to 1/2 of the 10 mg tablet.  Home readings have been bouncing around since then, between 105-164 systolic.    Past Medical History: hyperlipidemia 8/22 LDL 120 on no current cholesterol meds  Pre-diabetes 8/22 A1c 6.4 - on metformin     Blood Pressure Goal:  130/80  Current Medications: olmesartan 40 mg qd, chlorthalidone 25 mg qd, amlodipine 10 mg qd, spironolactone 25 mg qd, carvedilol 25 mg bid  Family Hx: notes hypertension in both parents and all siblings, but only hers has been hard to manage, children w/o hypertension  Social Hx: no tobacco, no alcohol, no regualr caffeine  Diet: mix of home, eating out; hard to stand for long periods so not able to cook much,  no added salt; mix of meats, not many vegetables recently  Exercise: unable d/t knees and back,   Home BP readings:   AM 12 readings average 130/75; previous average 137/80  PM 11 readings average 127/73; previous average 140/81  Intolerances: nkda  Labs: 02/07/21  Na 140, K 4.1, Glu 159, BUN 31, SCr 1.0, GFR 60   Wt Readings from Last 3 Encounters:  03/22/21 235 lb (106.6 kg)  02/22/21 235 lb 0.2 oz (106.6 kg)  01/22/21 243 lb (110.2 kg)   BP Readings from Last 3 Encounters:  04/19/21 (!) 143/77  03/22/21 137/77  02/22/21 110/68   Pulse Readings from Last 3 Encounters:  04/19/21 (!) 57  03/22/21 (!) 53  02/22/21 (!) 54    Current Outpatient Medications  Medication Sig Dispense Refill   carvedilol (COREG) 25 MG tablet Take 1 tablet (25 mg total) by mouth 2 (two) times daily. 180 tablet 3   docusate sodium (COLACE) 100 MG capsule Take 1 capsule (100 mg total) by mouth 2 (two) times daily as needed for mild constipation. (Patient taking differently: Take 200 mg by mouth at bedtime as needed (constipation).) 30 capsule 1   HYDROcodone-acetaminophen (NORCO/VICODIN) 5-325 MG tablet Take 1 tablet by mouth every 6 (six) hours as needed for moderate pain.     ibuprofen (ADVIL,MOTRIN) 200 MG tablet Take 3 tablets (600 mg total) by mouth at bedtime as needed for moderate pain. Resume 5 days post-op as needed 30 tablet 0  olmesartan (BENICAR) 40 MG tablet Take 1 tablet by mouth daily.     amLODipine (NORVASC) 10 MG tablet Take 1 tablet (10 mg total) by mouth daily. (Patient taking differently: Take 5 mg by mouth daily.) 90 tablet 3   chlorthalidone (HYGROTON) 25 MG tablet Take 1 tablet (25 mg total) by mouth daily. (Patient not taking: Reported on 04/19/2021) 90 tablet 3   furosemide (LASIX) 20 MG tablet Take 20 mg by mouth daily as needed for fluid. (Patient not taking: Reported on 04/19/2021)     gabapentin (NEURONTIN) 300 MG capsule Take 2 capsules (600 mg total) by mouth 3 (three) times  daily. (Patient taking differently: Take 600 mg by mouth 3 (three) times daily as needed.) 90 capsule 2   spironolactone (ALDACTONE) 25 MG tablet Take 1 tablet (25 mg total) by mouth daily. (Patient not taking: Reported on 04/19/2021) 90 tablet 3   No current facility-administered medications for this visit.    No Known Allergies  Past Medical History:  Diagnosis Date   Abdominal pain    Aortic stenosis 01/22/2021   Arthritis    Carotid bruit 01/22/2021   Complication of anesthesia    Hard to wake up.   Heart murmur    Hypercholesteremia    Hypertension    Left leg DVT ([facility]) 06/2008   s/p knee surgery   Left leg pain    Pneumonia    Prediabetes    Resistant hypertension 01/22/2021   Screening for Secondary Hypertension: { Click here to document screening for secondary causes of HTN  :1}  Causes 01/22/2021 04/19/2021  Drugs/Herbals Screened Screened     - Comments limits salt.  She doesn't use meloxicam.  She uses ibuprofen bid. -  Renovascular HTN Screened Screened     - Comments Check CT abdomen -  Sleep Apnea Screened Screened     - Comments Snores.  No other symptoms. -  Thyroid Disease Screened Screened     - Comments Check TSH -  Hyperaldosteronism Screened Screened     - Comments CTA abdomen.  We will check renin and aldosterone once her blood pressure is better controlled and olmesartan can be held. labs WNL  Pheochromocytoma Screened Not Screened     - Comments CTA abdomen.  She is asymptomatic. -  Cushing's Syndrome Not Screened Not Screened  Hyperparathyroidism Screened Screened  Coarctation of the Aorta Screened Screened     - Comments Blood pressure symmetric but she has a bruit in the right carotid and into the right upper extremity.  We will get carotid ultrasound. -  Compliance Screened Screened    Relevant Labs/Studies: Basic Labs Latest Ref Rng & Units 02/07/2021 01/24/2021 01/13/2018  Sodium 134 - 144 mmol/L 140 141 138  Potassium 3.5 - 5.2 mmol/L 4.1  3.7 3.6  Creatinine 0.57 - 1.00 mg/dL 3.66 4.40 3.47    Thyroid  Latest Ref Rng & Units 01/24/2021  TSH 0.450 - 4.500 uIU/mL 2.700    Renin/Aldosterone  Latest Ref Rng & Units 02/26/2021  Aldosterone 0.0 - 30.0 ng/dL 6.4  Renin 4.259 - 5.638 ng/mL/hr 0.680  Aldos/Renin Ratio 0.0 - 30.0 9.4             Blood pressure (!) 143/77, pulse (!) 57.  Resistant hypertension Patient with resistant hypertension, had stopped 3 medications due to previous low BP reading.  Unfortunately all 3 stopped medications were diuretics and she is showing signs of edema in her legs today.  Will have her take furosemide  20 mg daily for 2 days then resume using prn.  Will add chlorthalidone 25 mg back in to her regimen, and move 5 mg amlodipine to evening for better balance of medicine.  She will continue with regular home monitoring and will follow up with Dr. Duke Salvia next month to conclude our [employer].     Phillips Hay PharmD CPP [facility] [address] [place], [place] [address] [phone]

## 2021-04-19 NOTE — Assessment & Plan Note (Addendum)
Patient with resistant hypertension, had stopped 3 medications due to previous low BP reading.  Unfortunately all 3 stopped medications were diuretics and she is showing signs of edema in her legs today.  Will have her take furosemide 20 mg daily for 2 days then resume using prn.  Will add chlorthalidone 25 mg back in to her regimen, and move 5 mg amlodipine to evening for better balance of medicine.  Patient notes she has had multiple lab draws by PCP in last month or two, so will check those for renal function.  She will continue with regular home monitoring and will follow up with Dr. Oval Linsey next month to conclude our [employer].

## 2021-05-02 DIAGNOSIS — M25561 Pain in right knee: Secondary | ICD-10-CM | POA: Diagnosis not present

## 2021-05-02 DIAGNOSIS — M25562 Pain in left knee: Secondary | ICD-10-CM | POA: Diagnosis not present

## 2021-05-09 DIAGNOSIS — I10 Essential (primary) hypertension: Secondary | ICD-10-CM | POA: Diagnosis not present

## 2021-05-09 DIAGNOSIS — R7303 Prediabetes: Secondary | ICD-10-CM | POA: Diagnosis not present

## 2021-05-09 DIAGNOSIS — Z20822 Contact with and (suspected) exposure to covid-19: Secondary | ICD-10-CM | POA: Diagnosis not present

## 2021-05-09 DIAGNOSIS — J029 Acute pharyngitis, unspecified: Secondary | ICD-10-CM | POA: Diagnosis not present

## 2021-05-13 ENCOUNTER — Ambulatory Visit: Payer: Medicare HMO

## 2021-05-13 ENCOUNTER — Other Ambulatory Visit: Payer: Medicare HMO

## 2021-05-29 ENCOUNTER — Encounter (HOSPITAL_BASED_OUTPATIENT_CLINIC_OR_DEPARTMENT_OTHER): Payer: Self-pay | Admitting: Cardiovascular Disease

## 2021-05-29 ENCOUNTER — Other Ambulatory Visit: Payer: Self-pay

## 2021-05-29 ENCOUNTER — Ambulatory Visit (HOSPITAL_BASED_OUTPATIENT_CLINIC_OR_DEPARTMENT_OTHER): Payer: Medicare Other | Admitting: Cardiovascular Disease

## 2021-05-29 DIAGNOSIS — Z006 Encounter for examination for normal comparison and control in clinical research program: Secondary | ICD-10-CM

## 2021-05-29 DIAGNOSIS — E1169 Type 2 diabetes mellitus with other specified complication: Secondary | ICD-10-CM | POA: Diagnosis not present

## 2021-05-29 DIAGNOSIS — I10 Essential (primary) hypertension: Secondary | ICD-10-CM | POA: Diagnosis not present

## 2021-05-29 DIAGNOSIS — E669 Obesity, unspecified: Secondary | ICD-10-CM

## 2021-05-29 DIAGNOSIS — E7800 Pure hypercholesterolemia, unspecified: Secondary | ICD-10-CM | POA: Diagnosis not present

## 2021-05-29 DIAGNOSIS — I350 Nonrheumatic aortic (valve) stenosis: Secondary | ICD-10-CM | POA: Diagnosis not present

## 2021-05-29 HISTORY — DX: Pure hypercholesterolemia, unspecified: E78.00

## 2021-05-29 HISTORY — DX: Type 2 diabetes mellitus with other specified complication: E66.9

## 2021-05-29 HISTORY — DX: Type 2 diabetes mellitus with other specified complication: E11.69

## 2021-05-29 NOTE — Assessment & Plan Note (Signed)
BP is better in the office than it has been at home.  Her BP here was initially in the 120s and then on repeat it was 105.  Initially, based on her home readings I was planning to add spironolactone.  However she previously had dizziness on this regimen.  The picture is somewhat confusing as her blood pressures at home seem to be very elevated.  We will have her get a 24-hour blood pressure monitor to rule out masked hypertension.  We will also have her bring her blood pressure monitor to follow-up.  Continue amlodipine, carvedilol, chlorthalidone, and olmesartan.  She only takes the Lasix as needed.  We discussed the importance of increasing her exercise and trying to get into water aerobics/pool exercises.  Continue limiting sodium intake. ?

## 2021-05-29 NOTE — Assessment & Plan Note (Signed)
Lipids were poorly controlled when checked 10/2020.  She would like to work on weight loss, diet, and exercise prior to starting any medications.  We are making the above changes today and we will repeat lipids and a CMP in 3 months. ?

## 2021-05-29 NOTE — Progress Notes (Signed)
? ?[facility] Follow-up:   ? ?Date:  05/29/2021  ? ?ID:  Andi Hence, DOB December 19, 1950, MRN [id number] ? ?PCP:  Glenis Smoker, MD  ?Cardiologist:  Skeet Latch, MD  ?Nephrologist: ? ?Referring MD: Glenis Smoker, *  ? ?CC: Hypertension ? ?History of Present Illness:   ? ?[name] [name] is a 71 y.o. female with a hx of hypertension, morbid obesity, pre-diabetes, and hyperlipidemia here for follow-up. She was initially seen 01/22/2021 to establish care in the [facility].  She saw Dr. Lindell Noe  on 11/2020 and her blood pressure at that visit was 186/98. She was started on HCTZ, olmesartan, diltiazem, and metoprolol there then was referred to the [facility].   ? ?At her last appointment she reported blood pressures ranging 140/60s to 180/90s at home. Her activity was also limited by bilateral knee pain. She was taking nightly ibuprofen and 3 potassium tablets daily. She had an abdominal CT 01/2021 that showed hyperplasia of the left adrenal gland. It was negative for renal artery stenosis. Renin and aldosterone levels were normal. She called with low blood pressures and Lasix, spironolactone, and chlorthalidone were all discontinued. Amlodipine was reduced to 5 mg. She saw our pharmacist 04/2021 and her blood pressure was 143/77. Chlorthalidone was resumed and amlodipine was switched to the evening. ? ?Today, she is feeling okay aside from a lack of energy, and generally feeling weak. She continues to suffer from chronic bilateral knee pain and back pain. Usually she is unable to stand for longer than a few months. Her exercise is severely limited by her pain. She is following Weight Watchers, but has been struggling with weight loss lately because she is unable to walk. However, she is trying some at-home leg exercises every day. She presents her BP log, and notes her at home readings have been higher than in clinic today  (122/82). On recheck her BP is 102/64 in both arms. Using the clinic's BP machine, it is 105/69. This morning she took her antihypertensives at 6:30 AM, which was before her morning reading. At home, her blood pressure has ranged from 130s-150s, sometimes as high as 160s. Usually she is sitting with her arm supported and straight when checking her BP. She has not been taking spironolactone. She confirms she is taking 5 mg Amlodipine. When she was on 10 mg, she developed frequent nocturia, maybe 12-14 times a night. The last time she took Lasix was Summer of last year due to retaining fluid in the hot weather. Concerning her diet, she has successfully reduced her salt intake and remains diligent with monitoring this. Previously she started vitamin B12 a few years ago while she was infected with COVID. She did not notice much difference so she stopped taking the B12 supplement. She denies any palpitations, chest pain, shortness of breath, or peripheral edema. No lightheadedness, headaches, syncope, orthopnea, or PND. ? ? ?Past Medical History:  ?Diagnosis Date  ? Abdominal pain   ? Aortic stenosis 01/22/2021  ? Arthritis   ? Carotid bruit 01/22/2021  ? Complication of anesthesia   ? Hard to wake up.  ? Diabetes mellitus type 2 in obese ([place]) 05/29/2021  ? Heart murmur   ? Hypercholesteremia   ? Hypertension   ? Left leg DVT ([place]) 06/2008  ? s/p knee surgery  ? Left leg pain   ? Pneumonia   ? Prediabetes   ? Pure hypercholesterolemia 05/29/2021  ? 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 ? ?Past Surgical  History:  ?Procedure Laterality Date  ? BACK SURGERY    ? Revision of microlumbardecompression 04-16-17 Dr. Tonita Cong  ? CHOLECYSTECTOMY  12/24/10  ? KNEE SURGERY  2010  ? left mcl   ? LUMBAR LAMINECTOMY/DECOMPRESSION MICRODISCECTOMY Left 10/31/2016  ? Procedure: Microlumbar decompression L4-5 left ;  Surgeon: Susa Day, MD;  Location: [facility];  Service: Orthopedics;  Laterality: Left;  90 mins  ? LUMBAR  LAMINECTOMY/DECOMPRESSION MICRODISCECTOMY N/A 04/16/2017  ? Procedure: Revision microlumbar decompression L4-5;  Surgeon: Susa Day, MD;  Location: [facility];  Service: Orthopedics;  Laterality: N/A;  2 hrs  ? TUBAL LIGATION    ? ? ?Current Medications: ?Current Meds  ?Medication Sig  ? amLODipine (NORVASC) 5 MG tablet Take 5 mg by mouth daily.  ? carvedilol (COREG) 25 MG tablet Take 1 tablet (25 mg total) by mouth 2 (two) times daily.  ? chlorthalidone (HYGROTON) 25 MG tablet Take 1 tablet (25 mg total) by mouth daily.  ? docusate sodium (COLACE) 100 MG capsule Take 1 capsule (100 mg total) by mouth 2 (two) times daily as needed for mild constipation. (Patient taking differently: Take 200 mg by mouth at bedtime as needed (constipation).)  ? furosemide (LASIX) 20 MG tablet Take 20 mg by mouth daily as needed for fluid.  ? gabapentin (NEURONTIN) 300 MG capsule Take 2 capsules (600 mg total) by mouth 3 (three) times daily. (Patient taking differently: Take 600 mg by mouth 3 (three) times daily as needed.)  ? HYDROcodone-acetaminophen (NORCO/VICODIN) 5-325 MG tablet Take 1 tablet by mouth every 6 (six) hours as needed for moderate pain.  ? ibuprofen (ADVIL,MOTRIN) 200 MG tablet Take 3 tablets (600 mg total) by mouth at bedtime as needed for moderate pain. Resume 5 days post-op as needed  ? olmesartan (BENICAR) 40 MG tablet Take 1 tablet by mouth daily.  ? [DISCONTINUED] amLODipine (NORVASC) 10 MG tablet Take 1 tablet (10 mg total) by mouth daily. (Patient taking differently: Take 5 mg by mouth daily.)  ? [DISCONTINUED] spironolactone (ALDACTONE) 25 MG tablet Take 1 tablet (25 mg total) by mouth daily.  ?  ? ?Allergies:   Patient has no known allergies.  ? ?Social History  ? ?Socioeconomic History  ? Marital status: Married  ?  Spouse name: Not on file  ? Number of children: 2  ? Years of education: 60  ? Highest education level: High school graduate  ?Occupational History  ? Occupation: Soil scientist  ?Tobacco Use   ? Smoking status: Never  ? Smokeless tobacco: Never  ?Vaping Use  ? Vaping Use: Never used  ?Substance and Sexual Activity  ? Alcohol use: No  ? Drug use: No  ? Sexual activity: Not Currently  ?Other Topics Concern  ? Not on file  ?Social History Narrative  ? Lives at home with husband.  ? Right-handed.  ? No caffeine use.  ? ?Social Determinants of Health  ? ?Financial Resource Strain: Low Risk   ? Difficulty of Paying Living Expenses: Not hard at all  ?Food Insecurity: No Food Insecurity  ? Worried About Charity fundraiser in the Last Year: Never true  ? Ran Out of Food in the Last Year: Never true  ?Transportation Needs: No Transportation Needs  ? Lack of Transportation (Medical): No  ? Lack of Transportation (Non-Medical): No  ?Physical Activity: Inactive  ? Days of Exercise per Week: 0 days  ? Minutes of Exercise per Session: 0 min  ?Stress: Not on file  ?Social Connections: Not on  file  ?  ? ?Family History: ?[name] family history includes Heart disease in her father and mother; Heart failure in her father; Hypertension in her brother, father, mother, and sister. ? ?ROS:   ?Please see the history of present illness.    ?(+) Generalized weakness ?(+) Fatigue ?(+) Bilateral knee pain ?(+) Back pain ?All other systems reviewed and are negative. ? ?EKGs/Labs/Other Studies Reviewed:   ? ?LUE Venous Doppler 03/29/2021: ?FINDINGS: ?Contralateral Subclavian Vein: Respiratory phasicity is normal and ?symmetric with the symptomatic side. No evidence of thrombus. Normal ?compressibility. ?  ?Internal Jugular Vein: No evidence of thrombus. Normal ?compressibility, respiratory phasicity and response to augmentation. ?  ?Subclavian Vein: No evidence of thrombus. Normal compressibility, ?respiratory phasicity and response to augmentation. ?  ?Axillary Vein: No evidence of thrombus. Normal compressibility, ?respiratory phasicity and response to augmentation. ?  ?Cephalic Vein: No evidence of thrombus. Normal  compressibility, ?respiratory phasicity and response to augmentation. ?  ?Basilic Vein: Positive for focal occlusion of a superficial branch ?of the basilic vein in the proximal forearm. Thrombus is relatively ?echogenic suggesting a subacu

## 2021-05-29 NOTE — Patient Instructions (Addendum)
Medication Instructions:  ?Your physician recommends that you continue on your current medications as directed. Please refer to the Current Medication list given to you today. ? ?Labwork: ?NONE ? ?Testing/Procedures: ?Your physician has requested that you have an echocardiogram. Echocardiography is a painless test that uses sound waves to create images of your heart. It provides your doctor with information about the size and shape of your heart and how well your heart?s chambers and valves are working. This procedure takes approximately one hour. There are no restrictions for this procedure. ? ?Follow-Up: ?6 MONTHS WITH DR [place]  ? ?2 MONTHS WITH KRISTIN AT [facility] BLOOD PRESSURE  ? ?SOON WITH PHARM D FOR OZEMPIC DISCUSSION  ? ?06/18/2021 at 10:00 am at [facility] for 24 hour blood pressure monitor ?You will need to return the following day  ? ?If you need a refill on your cardiac medications before your next appointment, please call your pharmacy. ?

## 2021-05-29 NOTE — Assessment & Plan Note (Signed)
She has no symptoms.  Mean gradient 12 mmHg in 2015.  We will repeat echo.   ?

## 2021-05-29 NOTE — Research (Signed)
I saw pt today after Dr. Leonides Sake follow up visit. Pt is in Dr. Leonides Sake Virtual care HTN study. Pt filled out research survey. Pt was enrolled in Group 1.  ?

## 2021-05-29 NOTE — Assessment & Plan Note (Signed)
Hemoglobin A1c was 6.6%.  She also needs to work on weight loss.  She struggles with knee pain and back pain that makes it hard for her to exercise.  We did discuss starting Ozempic.  She will meet with our pharmacist.  She will also work on exercise as above.  We discussed limiting the carbohydrates in her diet and increasing her lean proteins, fruits and vegetables. ?

## 2021-06-10 DIAGNOSIS — I10 Essential (primary) hypertension: Secondary | ICD-10-CM | POA: Diagnosis not present

## 2021-06-10 DIAGNOSIS — M545 Low back pain, unspecified: Secondary | ICD-10-CM | POA: Diagnosis not present

## 2021-06-10 DIAGNOSIS — G8929 Other chronic pain: Secondary | ICD-10-CM | POA: Diagnosis not present

## 2021-06-12 ENCOUNTER — Ambulatory Visit (INDEPENDENT_AMBULATORY_CARE_PROVIDER_SITE_OTHER): Payer: Medicare Other

## 2021-06-12 DIAGNOSIS — I350 Nonrheumatic aortic (valve) stenosis: Secondary | ICD-10-CM | POA: Diagnosis not present

## 2021-06-12 DIAGNOSIS — I10 Essential (primary) hypertension: Secondary | ICD-10-CM

## 2021-06-12 LAB — ECHOCARDIOGRAM COMPLETE
AR max vel: 1.35 cm2
AV Area VTI: 1.27 cm2
AV Area mean vel: 1.29 cm2
AV Mean grad: 10 mmHg
AV Peak grad: 18.5 mmHg
AV Vena cont: 0.42 cm
Ao pk vel: 2.15 m/s
Area-P 1/2: 4.57 cm2
Calc EF: 54.5 %
P 1/2 time: 666 msec
S' Lateral: 3.97 cm
Single Plane A2C EF: 51.1 %
Single Plane A4C EF: 56.7 %

## 2021-06-18 ENCOUNTER — Ambulatory Visit (INDEPENDENT_AMBULATORY_CARE_PROVIDER_SITE_OTHER): Payer: Medicare Other

## 2021-06-18 ENCOUNTER — Other Ambulatory Visit (HOSPITAL_BASED_OUTPATIENT_CLINIC_OR_DEPARTMENT_OTHER): Payer: Self-pay | Admitting: Cardiovascular Disease

## 2021-06-18 DIAGNOSIS — I10 Essential (primary) hypertension: Secondary | ICD-10-CM

## 2021-06-18 DIAGNOSIS — Z79899 Other long term (current) drug therapy: Secondary | ICD-10-CM | POA: Diagnosis not present

## 2021-06-18 DIAGNOSIS — Z5181 Encounter for therapeutic drug level monitoring: Secondary | ICD-10-CM | POA: Diagnosis not present

## 2021-06-18 NOTE — Progress Notes (Unsigned)
24 Hour ambulatory blood pressure monitor applied using adult large cuff to left arm. ? ?

## 2021-06-18 NOTE — Progress Notes (Signed)
Patient ID: [name] [name]                 DOB: 1950-06-29                    MRN: [id number] ? ? ? ?HPI: ?[name] [name] is a 71 y.o. female patient referred to pharmacy clinic by Dr. Oval Linsey to initiate weight loss therapy with GLP 1-RA. PMH is significant for HTN, morbid obesity, newly diagnosed DM (A1c 6.6% on 05/29/21) and HLD. She was initially seen 01/22/2021 to establish care in the Advanced Hypertension Clinic. Saw Dr. Oval Linsey on 05/29/21 and reported having lack of energy and feeling weak. Suffers from chronic bilateral knee pain and back pain. Exercise is limited by her pain. Following Weight Watchers, but has been struggling with weight loss due to difficulty walking.  ? ?Pt presents today with her daughter for GLP-1 education. Has tried Weight Watchers in the past with success, but is not currently eating enough to get her points in. Has not tried anything else for weight loss. She needs knee replacement surgery but was advised she needs to lose 10 lbs first. Inquires if she will have to check her blood sugars every day on GLP1RA and if it will cause her to have low sugars. ? ?Current weight management medications: None ? ?Previously tried meds: None ? ?Current meds that may affect weight: gabapentin, carvedilol ? ?Baseline weight/BMI: 240 lbs / 39 ? ?Insurance payor: [employer] ? ?Diet:  ?-Breakfast: does not eat a lot - eggs, cereal (Honey Nut Cheerios) ?-Lunch: spinach dips/chips ?-Dinner: salad from [place]; fried chicken ?-Snacks: sugar-free wafers, sugar-free popsicle ?-Drinks: Half-glass sprite every once in a while, mostly water ? ?Exercise: Trying some at-home leg exercises every day, but is struggling for weight loss lately because she is unable to walk easily ? ?Family History: [name] family history includes Heart disease in her father and mother; Heart failure in her father; Hypertension in her brother, father, mother, and sister. ? ?Social History:  Denies tobacco, alcohol, and illicit drug use ? ?Labs: A1c 6.6% (05/29/21) ? ?Wt Readings from Last 1 Encounters:  ?05/29/21 237 lb (107.5 kg)  ? ? ?BP Readings from Last 1 Encounters:  ?05/29/21 102/64  ? ?Pulse Readings from Last 1 Encounters:  ?05/29/21 70  ? ? ?No results found for: CHOL, TRIG, HDL, CHOLHDL, VLDL, LDLCALC, LDLDIRECT ? ?Past Medical History:  ?Diagnosis Date  ? Abdominal pain   ? Aortic stenosis 01/22/2021  ? Arthritis   ? Carotid bruit 01/22/2021  ? Complication of anesthesia   ? Hard to wake up.  ? Diabetes mellitus type 2 in obese ([place]) 05/29/2021  ? Heart murmur   ? Hypercholesteremia   ? Hypertension   ? Left leg DVT ([place]) 06/2008  ? s/p knee surgery  ? Left leg pain   ? Pneumonia   ? Prediabetes   ? Pure hypercholesterolemia 05/29/2021  ? 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 ? ?Current Outpatient Medications on File Prior to Visit  ?Medication Sig Dispense Refill  ? amLODipine (NORVASC) 5 MG tablet Take 5 mg by mouth daily.    ? carvedilol (COREG) 25 MG tablet Take 1 tablet (25 mg total) by mouth 2 (two) times daily. 180 tablet 3  ? chlorthalidone (HYGROTON) 25 MG tablet Take 1 tablet (25 mg total) by mouth daily. 90 tablet 3  ? docusate sodium (COLACE) 100 MG capsule Take 1 capsule (100 mg total) by mouth 2 (two) times daily  as needed for mild constipation. (Patient taking differently: Take 200 mg by mouth at bedtime as needed (constipation).) 30 capsule 1  ? furosemide (LASIX) 20 MG tablet Take 20 mg by mouth daily as needed for fluid.    ? gabapentin (NEURONTIN) 300 MG capsule Take 2 capsules (600 mg total) by mouth 3 (three) times daily. (Patient taking differently: Take 600 mg by mouth 3 (three) times daily as needed.) 90 capsule 2  ? HYDROcodone-acetaminophen (NORCO/VICODIN) 5-325 MG tablet Take 1 tablet by mouth every 6 (six) hours as needed for moderate pain.    ? ibuprofen (ADVIL,MOTRIN) 200 MG tablet Take 3 tablets (600 mg total) by mouth at bedtime as needed for moderate pain. Resume  5 days post-op as needed 30 tablet 0  ? olmesartan (BENICAR) 40 MG tablet Take 1 tablet by mouth daily.    ? ?No current facility-administered medications on file prior to visit.  ? ?No Known Allergies ? ?Assessment/Plan: ? ?1. Weight loss - Patient has not met goal of at least 5% of body weight loss with comprehensive lifestyle modifications alone in the past 3-6 months. Pharmacotherapy is appropriate to pursue as augmentation. Will start Ozempic 0.25 mg subcutaneously. Discussed anticipated monthly copay of ~$45 which pt is ok with. ? ?Advised patient on common side effects including nausea, diarrhea, dyspepsia, decreased appetite, and fatigue. Counseled patient on reducing meal size and how to titrate medication to minimize side effects. Counseled patient to call if intolerable side effects or if experiencing dehydration, abdominal pain, or dizziness.  ? ?Injection technique reviewed at today's visit. Informed patient she did not need to check her blood sugars daily unless advised to do so by her PCP. Also discussed that Ozempic should not cause low blood sugars.  ? ?Titration Plan:  ?Will plan to follow the titration plan as below, pending patient is tolerating each dose before increasing to the next. Can slow titration if needed for tolerability.  ?  ?Dosing schedule: ?- Month 1: Inject Ozempic 0.26m subcutaneously once weekly for 4 weeks ?- Month 2: Inject Ozempic 0.533msubcutaneously once weekly for 6 weeks ?- Month 3: Inject Ozempic 64m69mubcutaneously once weekly for 4 weeks ?- Month 4 and on: Inject Ozempic 2mg24mbcutaneously once weekly ? ?Will call pt monthly to evaluate medication tolerability and for dose titrations. ? ?[facility]. ? ?Patient seen with SaraDebria Garret4 Succasunnarmacy student ? ?Megan E. Supple, PharmD, BCACP, CPP ?[place] [id number] [id number]: [phone]: [phone]/2023  1:56 PM ? ?

## 2021-06-19 ENCOUNTER — Encounter: Payer: Self-pay | Admitting: Pharmacist

## 2021-06-19 ENCOUNTER — Ambulatory Visit: Payer: Medicare Other | Admitting: Pharmacist

## 2021-06-19 DIAGNOSIS — E1169 Type 2 diabetes mellitus with other specified complication: Secondary | ICD-10-CM | POA: Diagnosis not present

## 2021-06-19 DIAGNOSIS — E669 Obesity, unspecified: Secondary | ICD-10-CM | POA: Diagnosis not present

## 2021-06-19 MED ORDER — OZEMPIC (0.25 OR 0.5 MG/DOSE) 2 MG/3ML ~~LOC~~ SOPN: PEN_INJECTOR | SUBCUTANEOUS | 1 refills | Status: DC

## 2021-06-19 NOTE — Patient Instructions (Signed)
Ozempic Counseling Points ?This medication reduces your appetite and may make you feel fuller longer.  ?Stop eating when your body tells you that you are full. This will likely happen sooner than you are used to. ?Store your medication in the fridge until you are ready to use it. ?Inject your medication in the fatty tissue of your lower abdominal area (2 inches away from belly button) or upper outer thigh. Rotate injection sites. ?Each pen will last you about 1 month (the first month it will last a few weeks longer). Use a different needle with each weekly injection. ?Common side effects include: nausea, diarrhea/constipation, and heartburn, and are more likely to occur if you overeat. ? ?Dosing schedule: ?- Month 1: Inject Ozempic 0.25mg  subcutaneously once weekly for 4 weeks ?- Month 2: Inject Ozempic 0.5mg  subcutaneously once weekly for 6 weeks ?- Month 3: Inject Ozempic 1mg  subcutaneously once weekly for 4 weeks ?- Month 4 and on: Inject Ozempic 2mg  subcutaneously once weekly ? ?Recheck A1c at the [facility] office on Monday, August 14th ? ?Tips for living a healthier life ? ? ? ? ?Building a [facility] Diet ?Make most of your meal vegetables and fruits - ? of your plate. ?Aim for color and variety, and remember that potatoes don?t count as vegetables on the Healthy Eating Plate because of their negative impact on blood sugar. ? ?Go for whole grains - ? of your plate. ?Whole and intact grains--whole wheat, barley, wheat berries, quinoa, oats, brown rice, and foods made with them, such as whole wheat pasta--have a milder effect on blood sugar and insulin than white bread, white rice, and other refined grains. ? ?Protein power - ? of your plate. ?Fish, poultry, beans, and nuts are all healthy, versatile protein sources--they can be mixed into salads, and pair well with vegetables on a plate. Limit red meat, and avoid processed meats such as bacon and sausage. ? ?Healthy plant oils - in  moderation. ?Choose healthy vegetable oils like olive, canola, soy, corn, sunflower, peanut, and others, and avoid partially hydrogenated oils, which contain unhealthy trans fats. Remember that low-fat does not mean ?healthy.? ? ?Drink water, coffee, or tea. ?Skip sugary drinks, limit milk and dairy products to one to two servings per day, and limit juice to a small glass per day. ? ?Stay active. ?The red figure running across the Healthy Eating Plate?s placemat is a reminder that staying active is also important in weight control. ? ?The main message of the Healthy Eating Plate is to focus on diet quality: ? ?The type of carbohydrate in the diet is more important than the amount of carbohydrate in the diet, because some sources of carbohydrate--like vegetables (other than potatoes), fruits, whole grains, and beans--are healthier than others. ?The Healthy Eating Plate also advises consumers to avoid sugary beverages, a major source of calories--usually with little nutritional value--in the American diet. ?The Healthy Eating Plate encourages consumers to use healthy oils, and it does not set a maximum on the percentage of calories people should get each day from healthy sources of fat. In this way, the Healthy Eating Plate recommends the opposite of the low-fat message promoted for decades by the USDA. ? ?August 16 ? ?SUGAR ? ?Sugar is a huge problem in the modern day diet. Sugar is a big contributor to heart disease, diabetes, high triglyceride levels, fatty liver disease and obesity. Sugar is hidden in almost all packaged foods/beverages. Added sugar is extra sugar that is added beyond what is naturally found  and has no nutritional benefit for your body. The American Heart Association recommends limiting added sugars to no more than 25g for women and 36 grams for men per day. There are many names for sugar including maltose, sucrose (names ending in "ose"),  high fructose corn syrup, molasses, cane sugar, corn sweetener, raw sugar, syrup, honey or fruit juice concentrate.  ? ?One of the best ways to limit your added sugars is to stop drinking sweetened beverages such as soda, sweet tea, and fruit juice. ? ?There is 65g of added sugars in one 20oz bottle of Coke! That is equal to 7.5 donuts.  ? ?Pay attention and read all nutrition facts labels. Below is an examples of a nutrition facts label. The #1 is showing you the total sugars where the # 2 is showing you the added sugars. This one serving has almost the max amount of added sugars per day! ? ? ? ? ?20 oz Soda ?65g Sugar = 7.5 Glazed Donuts ? ?16oz Energy  ?Drink ?54g Sugar = 6.5 Glazed Donuts ? ?Large Sweet  ?Tea ?38g Sugar = 4 Glazed Donuts ? ?20oz Sports  ?Drink ?34g Sugar = 3.5 Glazed Donuts ? ?8oz Chocolate Milk ?24g Sugar =2.5 Glazed Donuts ? ?8oz Orange  ?Juice ?21g Sugar = 2 Glazed Donuts ? ?1 Juice Box ?14g Sugar = 1.5 Glazed Donuts ? ?16oz Water= NO SUGAR!! ? ?EXERCISE ? ?Exercise is good. We?ve all heard that. In an ideal world, we would all have time and resources to get plenty of it. When you are active, your heart pumps more efficiently and you will feel better.  Multiple studies show that even walking regularly has benefits that include living a longer life. The American Heart Association recommends 150 minutes per week of exercise (30 minutes per day most days of the week). You can do this in any increment you wish. Nine or more 10-minute walks count. So does an hour-long exercise class. Break the time apart into what will work in your life. Some of the best things you can do include walking briskly, jogging, cycling or swimming laps. Not everyone is ready to ?exercise.? Sometimes we need to start with just getting active. Here are some easy ways to be more active throughout the day: ? Take the stairs instead of the elevator ? Go for a 10-15 minute walk during your lunch break (find a friend to make  it more enjoyable) ? When shopping, park at the back of the parking lot ? If you take public transportation, get off one stop early and walk the extra distance ? Pace around while making phone calls ? ?Check with your doctor if you aren?t sure what your limitations may be. Always remember to drink plenty of water when doing any type of exercise. Don?t feel like a failure if you?re not getting the 90-150 minutes per week. If you started by being a couch potato, then just a 10-minute walk each day is a huge improvement. Start with little victories and work your way up. ? ? ?HEALTHY EATING TIPS ? ?When looking to improve your eating habits, whether to lose weight, lower blood pressure or just be healthier, it helps to know what a serving size is.  ? ?Grains ?1 slice of bread, ? bagel, ? cup pasta or rice  Vegetables ?1 cup fresh or raw vegetables, ? cup cooked or canned ?Fruits ?1 piece of medium sized fruit, ? cup canned,   Meats/Proteins ?? cup dried  1 oz meat, 1 egg, ? cup cooked beans, nuts or seeds ? ?Dairy        Fats ?Individual yogurt container, 1 cup (8oz)    1 teaspoon margarine/butter or vegetable  ?milk or milk alternative, 1 slice of cheese          oil; 1 tablespoon mayonnaise or salad dressing                 ? ?Plan ahead: make a menu of the meals for a week then create a grocery list to go with that menu. Consider meals that easily stretch into a night of leftovers, such as stews or casseroles. Or consider making two of your favorite meal and put one in the freezer for another night. Try a night or two each week that is ?meatless? or ?no cook? such as salads. When you get home from the grocery store wash and prepare your vegetables and fruits. Then when you need them they are ready to go.  ? ?Tips for going to the grocery store: ? [place] store or generic brands ? Check the weekly ad from your store on-line or in their in-store flyer ? Look at the unit price on the shelf tag to compare/contrast the  costs of different items ? Buy fruits/vegetables in season ? Carrots, bananas and apples are low-cost, naturally healthy items ? If meats or frozen vegetables are on sale, buy some extras and put in your freezer ? Limit b

## 2021-06-25 DIAGNOSIS — M2578 Osteophyte, vertebrae: Secondary | ICD-10-CM | POA: Diagnosis not present

## 2021-06-25 DIAGNOSIS — M47816 Spondylosis without myelopathy or radiculopathy, lumbar region: Secondary | ICD-10-CM | POA: Diagnosis not present

## 2021-06-25 DIAGNOSIS — M545 Low back pain, unspecified: Secondary | ICD-10-CM | POA: Diagnosis not present

## 2021-07-08 DIAGNOSIS — M5136 Other intervertebral disc degeneration, lumbar region: Secondary | ICD-10-CM | POA: Diagnosis not present

## 2021-07-16 ENCOUNTER — Telehealth: Payer: Self-pay | Admitting: Pharmacist

## 2021-07-16 DIAGNOSIS — R350 Frequency of micturition: Secondary | ICD-10-CM | POA: Diagnosis not present

## 2021-07-16 NOTE — Telephone Encounter (Signed)
Called pt to follow up with Ozempic tolerability and dose titration. No answer and voicemail is full, unable to leave message. She has also not viewed the MyChart message I sent last month. Will try back later. ?

## 2021-07-19 NOTE — Telephone Encounter (Signed)
2nd message left for pt. Phone goes to voicemail after 1 ring. ?

## 2021-07-19 NOTE — Telephone Encounter (Signed)
Pt returned call to clinic. States she didn't start on Ozempic because the copay was too high at around $400 due to her deductible. Follow up fills $45/month. Then decided she wanted to start on the medication. In the process of having the pharmacy fill it. Won't need A1c checked in August like we had planned at last visit. She sees Dr Duke Salvia in Sept, can have labs rechecked then. I'll call her in 1 month to follow up with tolerability and dose increase. ?

## 2021-07-25 DIAGNOSIS — N390 Urinary tract infection, site not specified: Secondary | ICD-10-CM | POA: Diagnosis not present

## 2021-07-30 DIAGNOSIS — M5416 Radiculopathy, lumbar region: Secondary | ICD-10-CM | POA: Diagnosis not present

## 2021-08-06 IMAGING — CT CT ABD-PELV W/O CM
1 of 2 series · 13 of 32 positions shown, 18 images · non-contrast
Comparison: Abdominal ultrasound of 11/16/2010.  No prior CT.

CLINICAL DATA: Recurrent urinary tract infections for 1 month.
Question underlying kidney stone. Dysuria.

EXAM:
CT ABDOMEN AND PELVIS WITHOUT CONTRAST
TECHNIQUE: Multidetector CT imaging of the abdomen and pelvis was performed
following the standard protocol without IV contrast.

[Series 2: abd/pelvis w/(date) · axial · 0.88mm/px · z∈[-429,-34]mm · 13 of 89 slices shown, 18 images]
[im 5/89  soft-tissue]
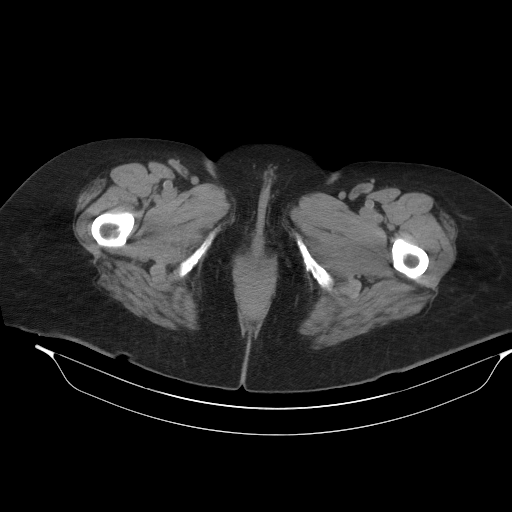
[im 5/89  bone]
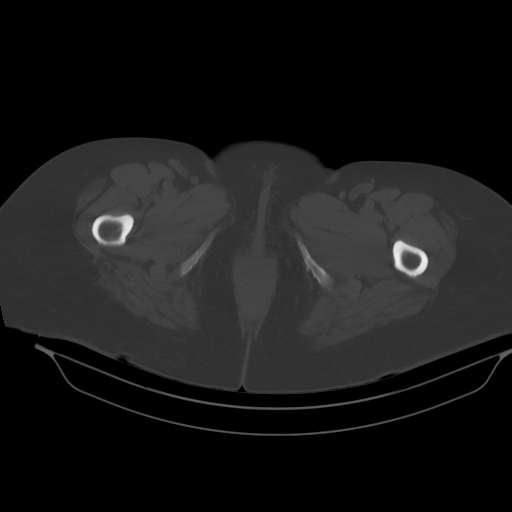
[im 14/89  soft-tissue]
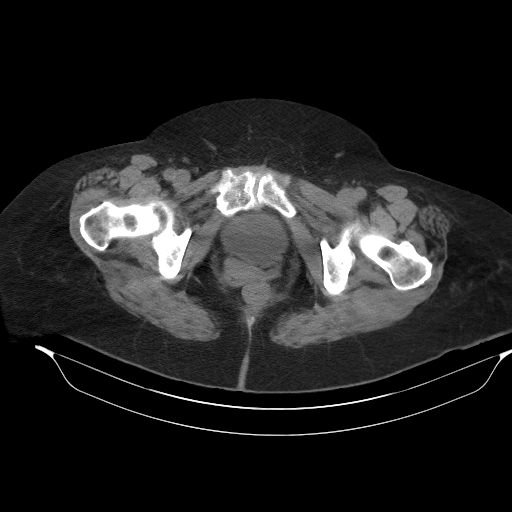
[im 19/89  soft-tissue]
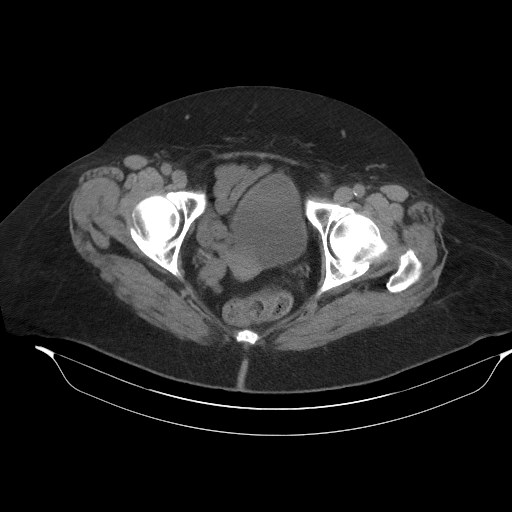
[im 28/89  soft-tissue]
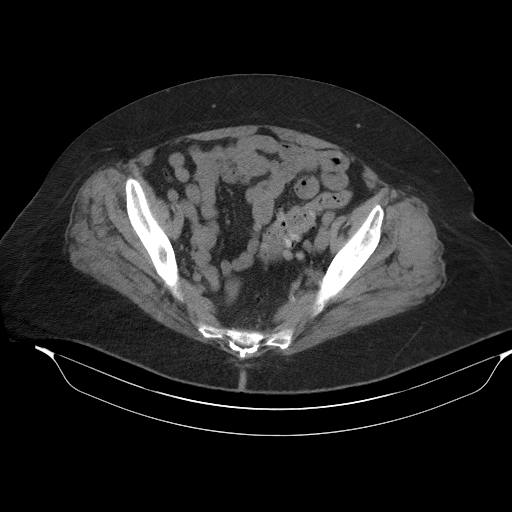
[im 33/89  soft-tissue]
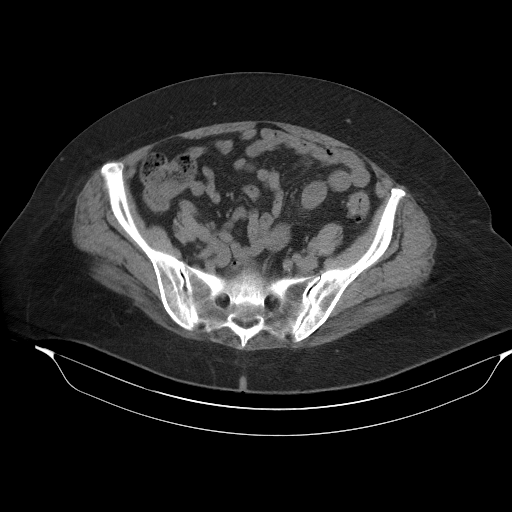
[im 42/89  soft-tissue]
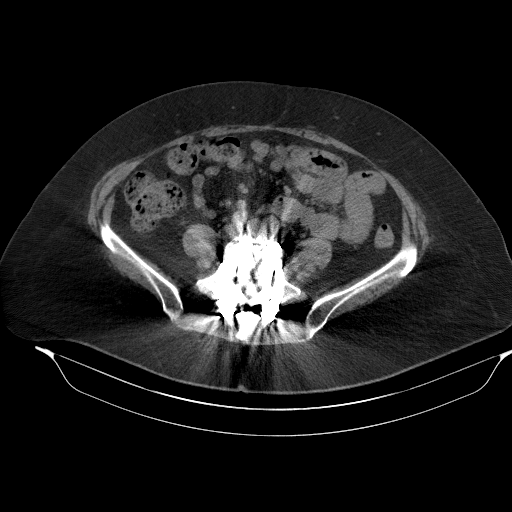
[im 47/89  soft-tissue]
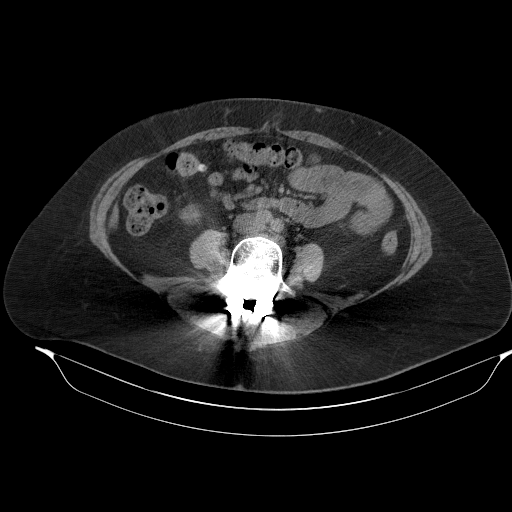
[im 56/89  soft-tissue]
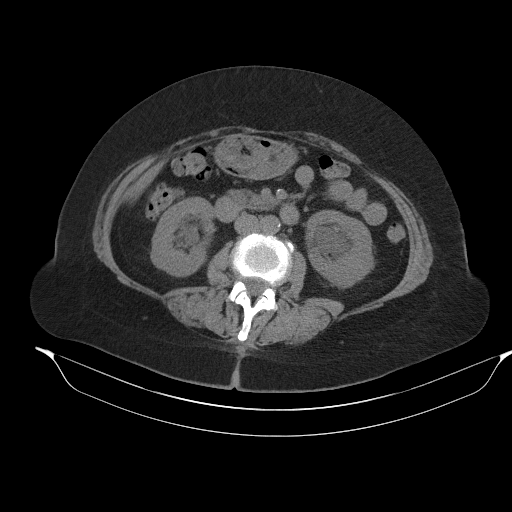
[im 61/89  soft-tissue]
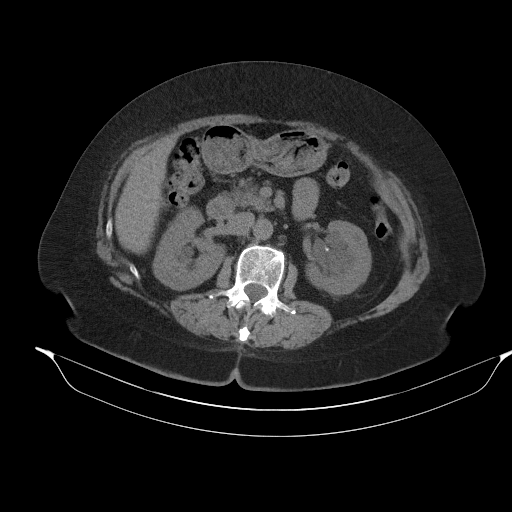
[im 61/89  bone]
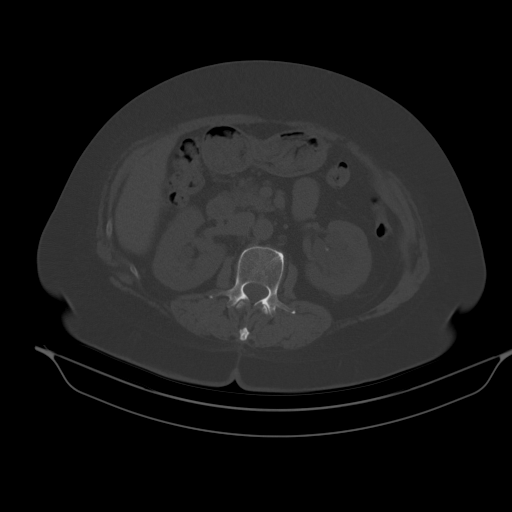
[im 70/89  soft-tissue]
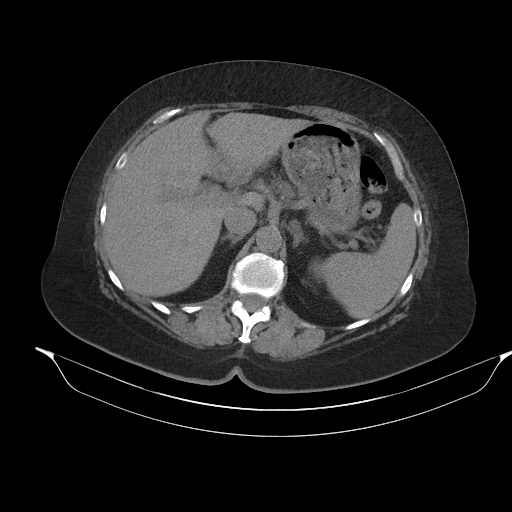
[im 70/89  lung]
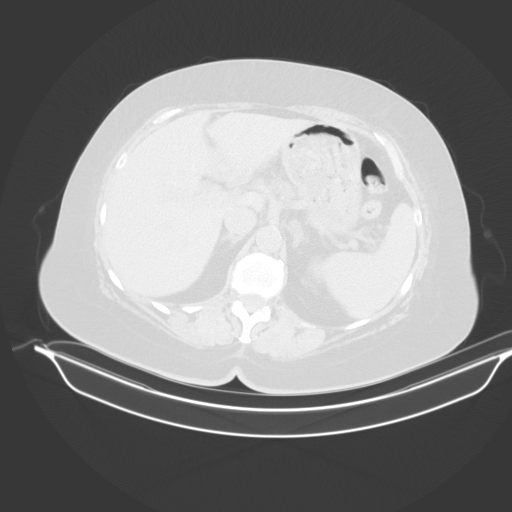
[im 75/89  soft-tissue]
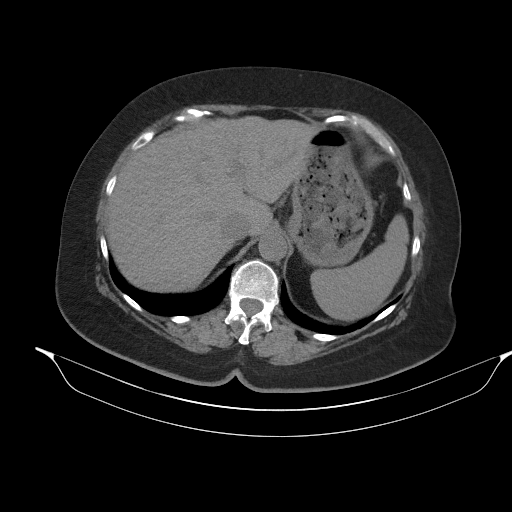
[im 75/89  lung]
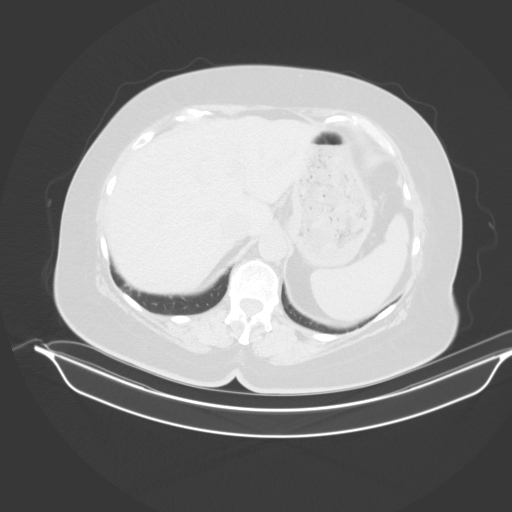
[im 79/89  lung]
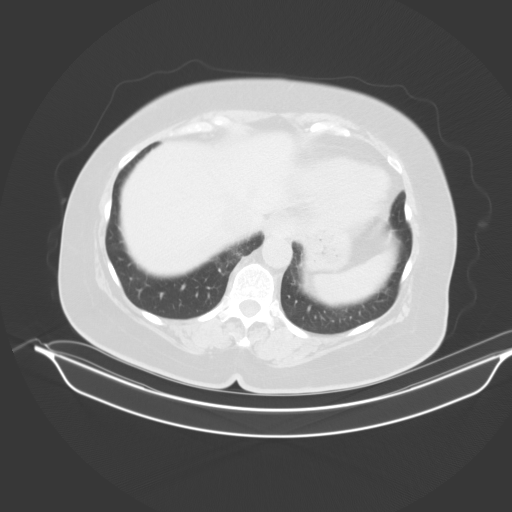
[im 84/89  soft-tissue]
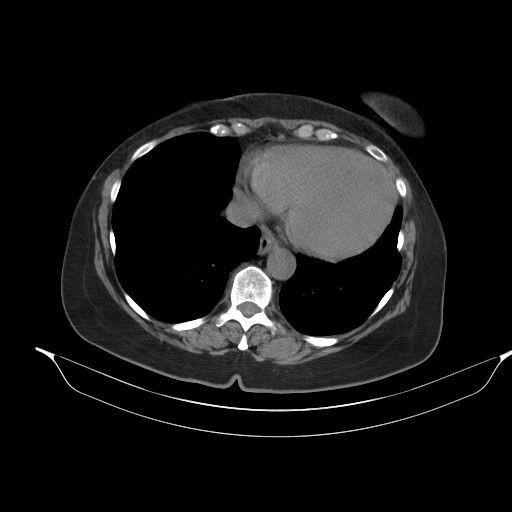
[im 84/89  lung]
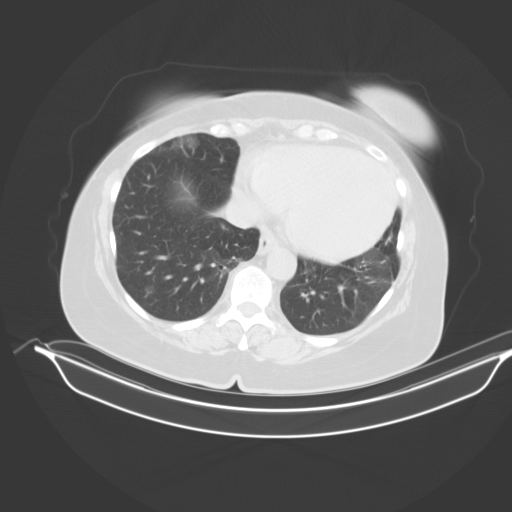

[13 of 32 positions shown; findings below may reference images not displayed]

FINDINGS: Lower chest: Areas of mild bibasilar peripheral predominant
ground-glass. Cardiomegaly, without pericardial or pleural effusion.
Right coronary artery atherosclerosis.

Hepatobiliary: Subcapsular right hepatic lobe 9 mm low-density
lesion is likely a cyst.

Cholecystectomy, without biliary ductal dilatation.

Pancreas: Fatty replacement in the pancreatic head and uncinate
process.

Spleen: Normal in size, without focal abnormality.

Adrenals/Urinary Tract: Normal right adrenal gland. Mild left
adrenal thickening and nodularity, low-density favoring an
underlying adenoma.

Calcification in the central left kidney is favored to represent a
collecting system calculus, including 3 mm on [DATE]. Left and
probable right lower pole renal sinus cysts. There is also
suggestion of cortical hypoattenuation, including within the upper
pole left kidney on [DATE].

No hydroureter. Beam hardening artifact from lumbar spine surgery
degrades evaluation of the ureters. No convincing evidence of
ureteric stone. No bladder calculi. Pelvic floor laxity with mild
cystocele.

Stomach/Bowel: Normal stomach, without wall thickening. Extensive
colonic diverticulosis. Normal terminal ileum and appendix. Normal
small bowel.

Vascular/Lymphatic: Aortic atherosclerosis. No abdominopelvic
adenopathy.

Reproductive: Normal uterus and adnexa.

Other: No significant free fluid.  Moderate pelvic floor laxity.

Musculoskeletal: L4-5 trans pedicle screw fixation. Degenerate disc
disease at L3-4.
IMPRESSION: 1. Probable left nephrolithiasis.  No hydroureter or distal stone.
2. Left and probable right-sided renal sinus cysts. Areas of
hypoattenuation within the upper pole left kidney are indeterminate.
Differential considerations include cortical cysts or given somewhat
ill-defined appearance, pyelonephritis. Consider contrast enhanced
abdominal CT.
3. Pelvic floor laxity with mild cystocele.
4. Coronary artery atherosclerosis. Aortic Atherosclerosis
([id number]).
5.  Possible constipation.
6. Bibasilar mild pulmonary ground-glass at the bases. Although this
could simply represent hypoventilation and air trapping, a viral or
atypical infectious process could look similar. Correlate with
infectious symptoms.

## 2021-08-08 DIAGNOSIS — R350 Frequency of micturition: Secondary | ICD-10-CM | POA: Diagnosis not present

## 2021-08-12 ENCOUNTER — Ambulatory Visit (HOSPITAL_BASED_OUTPATIENT_CLINIC_OR_DEPARTMENT_OTHER): Payer: Medicare Other

## 2021-08-14 ENCOUNTER — Telehealth: Payer: Self-pay | Admitting: Pharmacist

## 2021-08-14 NOTE — Telephone Encounter (Signed)
Called pt and left message to follow up with Ozempic tolerability and dose titration.

## 2021-08-23 ENCOUNTER — Ambulatory Visit (HOSPITAL_BASED_OUTPATIENT_CLINIC_OR_DEPARTMENT_OTHER): Payer: Medicare Other | Admitting: Pharmacist Clinician (PhC)/ Clinical Pharmacy Specialist

## 2021-08-23 DIAGNOSIS — I10 Essential (primary) hypertension: Secondary | ICD-10-CM

## 2021-08-23 MED ORDER — FUROSEMIDE 20 MG PO TABS
20.0000 mg | ORAL_TABLET | Freq: Every day | ORAL | 2 refills | Status: AC | PRN
Start: 2021-08-23 — End: ?

## 2021-08-23 MED ORDER — CARVEDILOL 25 MG PO TABS: 25.0000 mg | ORAL_TABLET | Freq: Two times a day (BID) | ORAL | 3 refills | Status: AC

## 2021-08-23 MED ORDER — CHLORTHALIDONE 25 MG PO TABS: 12.5000 mg | ORAL_TABLET | Freq: Every day | ORAL | 3 refills | Status: AC

## 2021-08-23 NOTE — Patient Instructions (Signed)
Return for a a follow up appointment September 26 at 9 am  Check your blood pressure at home at least 3-5 days each week and keep record of the readings.  Take your BP meds as follows:  Restart the chlorthalidone at 1/2 tablet (12.5 mg) once daily  Only use furosemide as needed - please be cautious about this until after urology appt.  Continue with all other medications.   Bring all of your meds, your BP cuff and your record of home blood pressures to your next appointment.  Exercise as you're able, try to walk approximately 30 minutes per day.  Keep salt intake to a minimum, especially watch canned and prepared boxed foods.  Eat more fresh fruits and vegetables and fewer canned items.  Avoid eating in fast food restaurants.    HOW TO TAKE YOUR BLOOD PRESSURE: Rest 5 minutes before taking your blood pressure.  Don't smoke or drink caffeinated beverages for at least 30 minutes before. Take your blood pressure before (not after) you eat. Sit comfortably with your back supported and both feet on the floor (don't cross your legs). Elevate your arm to heart level on a table or a desk. Use the proper sized cuff. It should fit smoothly and snugly around your bare upper arm. There should be enough room to slip a fingertip under the cuff. The bottom edge of the cuff should be 1 inch above the crease of the elbow. Ideally, take 3 measurements at one sitting and record the average

## 2021-08-23 NOTE — Progress Notes (Signed)
08/27/2021 [name] [name] Jul 06, 1950 [id number]   HPI:  [name] [name] is a 71 y.o. female patient of Dr Duke Salvia, with a PMH below who presents today for advanced hypertension clinic follow up.  She was originally seen last month, with a BP of 193/81 and agreed to join our [employer].  She was randomized to group 1, to have monthly PharmD visits x 3.   Patient reported that she developed hypertension after her first pregnancy, and has always been difficult to control.  She also notes that she has been denied a much needed knee surgery because of uncontrolled hypertension.   An abdominal CT showed some thickening of the left adrenal gland, and appropriate medications were held to draw aldosterone/renin labs. There was no indication of RAS or fibromuscular dysplasia.    I saw her last in February and at that time had stopped lasix, chlorthalidone and spironolactone on her own 2/2 hypotension.  Edema returned and she was given furosemide x2 then prn and restarted chlorthalidone.  She saw Dr. Duke Salvia in March (study final visit), with a BP of 102/64.  Because of range of readings and hypotensive episodes, was puton 24 hour monitor.  It showed a mean BP of 119/54 with a nighttime dip of 4/10 points.  She has also been seen by Margaretmary Dys PharmD for Ozempic start.    Past Medical History: hyperlipidemia 8/22 LDL 120 on no current cholesterol meds  Pre-diabetes 8/22 A1c 6.4 - on metformin     Blood Pressure Goal:  130/80  Current Medications: olmesartan 40 mg qd, amlodipine 5 mg qd, carvedilol 25 mg bid  Family Hx: notes hypertension in both parents and all siblings, but only hers has been hard to manage, children w/o hypertension  Social Hx: no tobacco, no alcohol, no regualr caffeine  Diet: mix of home, eating out; hard to stand for long periods so not able to cook much, no added salt; mix of meats, not many vegetables recently, has cut back but not feeling  Ozempic full yet  Exercise: unable d/t knees and back,   Home BP readings:   No readings with her today, notes they range in the 150's with lowest in the 120's  Intolerances: nkda  Labs: 02/07/21  Na 140, K 4.1, Glu 159, BUN 31, SCr 1.0, GFR 60   Wt Readings from Last 3 Encounters:  06/19/21 (P) 240 lb (108.9 kg)  05/29/21 237 lb (107.5 kg)  03/22/21 235 lb (106.6 kg)   BP Readings from Last 3 Encounters:  05/29/21 102/64  04/19/21 (!) 143/77  03/22/21 137/77   Pulse Readings from Last 3 Encounters:  05/29/21 70  04/19/21 (!) 57  03/22/21 (!) 53    Current Outpatient Medications  Medication Sig Dispense Refill   amLODipine (NORVASC) 5 MG tablet Take 5 mg by mouth daily.     chlorthalidone (HYGROTON) 25 MG tablet Take 0.5 tablets (12.5 mg total) by mouth daily. 45 tablet 3   docusate sodium (COLACE) 100 MG capsule Take 1 capsule (100 mg total) by mouth 2 (two) times daily as needed for mild constipation. (Patient taking differently: Take 200 mg by mouth at bedtime as needed (constipation).) 30 capsule 1   gabapentin (NEURONTIN) 300 MG capsule Take 2 capsules (600 mg total) by mouth 3 (three) times daily. (Patient taking differently: Take 600 mg by mouth 3 (three) times daily as needed.) 90 capsule 2   HYDROcodone-acetaminophen (NORCO/VICODIN) 5-325 MG tablet Take 1 tablet by mouth  every 6 (six) hours as needed for moderate pain.     ibuprofen (ADVIL,MOTRIN) 200 MG tablet Take 3 tablets (600 mg total) by mouth at bedtime as needed for moderate pain. Resume 5 days post-op as needed 30 tablet 0   olmesartan (BENICAR) 40 MG tablet Take 1 tablet by mouth daily.     carvedilol (COREG) 25 MG tablet Take 1 tablet (25 mg total) by mouth 2 (two) times daily. 180 tablet 3   furosemide (LASIX) 20 MG tablet Take 1 tablet (20 mg total) by mouth daily as needed for fluid. 30 tablet 2   Semaglutide, 1 MG/DOSE, 4 MG/3ML SOPN Inject 1 mg into the skin once a week. 3 mL 0   No current  facility-administered medications for this visit.    No Known Allergies  Past Medical History:  Diagnosis Date   Abdominal pain    Aortic stenosis 01/22/2021   Arthritis    Carotid bruit 01/22/2021   Complication of anesthesia    Hard to wake up.   Diabetes mellitus type 2 in obese (HCC) 05/29/2021   Heart murmur    Hypercholesteremia    Hypertension    Left leg DVT (HCC) 06/2008   s/p knee surgery   Left leg pain    Pneumonia    Prediabetes    Pure hypercholesterolemia 05/29/2021   Resistant hypertension 01/22/2021   Screening for Secondary Hypertension:      01/22/2021    3:19 PM 04/19/2021    9:49 AM  Causes  Drugs/Herbals Screened Screened     - Comments limits salt.  She doesn't use meloxicam.  She uses ibuprofen bid.   Renovascular HTN Screened Screened     - Comments Check CT abdomen   Sleep Apnea Screened Screened     - Comments Snores.  No other symptoms.   Thyroid Disease Screened Screened     - Comments Check TSH   Hyperaldosteronism Screened Screened     - Comments CTA abdomen.  We will check renin and aldosterone once her blood pressure is better controlled and olmesartan can be held. labs WNL  Pheochromocytoma Screened Not Screened     - Comments CTA abdomen.  She is asymptomatic.   Cushing's Syndrome Not Screened Not Screened  Hyperparathyroidism Screened Screened  Coarctation of the Aorta Screened Screened     - Comments Blood pressure symmetric but she has a bruit in the right carotid and into the right upper extremity.  We will get carotid ultrasound.   Compliance Screened Screened    Relevant Labs/Studies:    Latest Ref Rng & Units 02/07/2021   11:43 AM 01/24/2021    9:51 AM 01/13/2018    7:05 AM  Basic Labs  Sodium 134 - 144 mmol/L 140  141  138   Potassium 3.5 - 5.2 mmol/L 4.1  3.7  3.6   Creatinine 0.57 - 1.00 mg/dL 0.08  6.76  1.95        Latest Ref Rng & Units 01/24/2021    9:51 AM  Thyroid   TSH 0.450 - 4.500 uIU/mL 2.700         Latest Ref Rng & Units 02/26/2021    9:30 AM  Renin/Aldosterone   Aldosterone 0.0 - 30.0 ng/dL 6.4   Renin 0.932 - 6.712 ng/mL/hr 0.680   Aldos/Renin Ratio 0.0 - 30.0 9.4              There were no vitals taken for this visit.  Resistant hypertension Patient with  essential hypertension, elevated in the office today.  She has not re-started the chlorthalidone yet, so will do so today, starting with 12.5 mg daily.  She should continue with regular home monitoring and we will follow up with her in 3 months.    Phillips Hay PharmD CPP [facility] [address] [place], [place] [address] [phone]

## 2021-08-26 MED ORDER — SEMAGLUTIDE (1 MG/DOSE) 4 MG/3ML ~~LOC~~ SOPN: 1.0000 mg | PEN_INJECTOR | SUBCUTANEOUS | 0 refills | Status: DC

## 2021-08-26 NOTE — Telephone Encounter (Signed)
Called pt again to follow up with her Ozempic injections. Has given 4th injection of the 0.5mg  dose. Hasn't noticed any change in her appetite or weight loss. Will increase to 1mg  weekly dose for the next month.

## 2021-08-26 NOTE — Addendum Note (Signed)
Addended by: Rai Sinagra E on: 08/26/2021 09:00 AM   Modules accepted: Orders

## 2021-08-27 ENCOUNTER — Encounter (HOSPITAL_BASED_OUTPATIENT_CLINIC_OR_DEPARTMENT_OTHER): Payer: Self-pay | Admitting: Pharmacist Clinician (PhC)/ Clinical Pharmacy Specialist

## 2021-08-27 NOTE — Assessment & Plan Note (Signed)
Patient with essential hypertension, elevated in the office today.  She has not re-started the chlorthalidone yet, so will do so today, starting with 12.5 mg daily.  She should continue with regular home monitoring and we will follow up with her in 3 months.

## 2021-08-30 DIAGNOSIS — R350 Frequency of micturition: Secondary | ICD-10-CM | POA: Diagnosis not present

## 2021-09-02 DIAGNOSIS — H524 Presbyopia: Secondary | ICD-10-CM | POA: Diagnosis not present

## 2021-09-02 DIAGNOSIS — H5213 Myopia, bilateral: Secondary | ICD-10-CM | POA: Diagnosis not present

## 2021-09-02 DIAGNOSIS — H25813 Combined forms of age-related cataract, bilateral: Secondary | ICD-10-CM | POA: Diagnosis not present

## 2021-09-02 DIAGNOSIS — H52201 Unspecified astigmatism, right eye: Secondary | ICD-10-CM | POA: Diagnosis not present

## 2021-09-05 ENCOUNTER — Telehealth: Payer: Self-pay | Admitting: *Deleted

## 2021-09-05 ENCOUNTER — Telehealth: Payer: Self-pay | Admitting: Cardiovascular Disease

## 2021-09-05 DIAGNOSIS — M1712 Unilateral primary osteoarthritis, left knee: Secondary | ICD-10-CM | POA: Diagnosis not present

## 2021-09-05 DIAGNOSIS — M1711 Unilateral primary osteoarthritis, right knee: Secondary | ICD-10-CM | POA: Diagnosis not present

## 2021-09-05 DIAGNOSIS — M170 Bilateral primary osteoarthritis of knee: Secondary | ICD-10-CM | POA: Diagnosis not present

## 2021-09-05 NOTE — Telephone Encounter (Signed)
   Name: [name] [name]  DOB: [birth year]  MRN: [id number]  Primary Cardiologist: Chilton Si, MD  Chart reviewed as part of pre-operative protocol coverage. Because of [name]'s past medical history and time since last visit, she will require a follow-up tele visit in order to better assess preoperative cardiovascular risk.  Pre-op covering staff: - Please schedule appointment and call patient to inform them. If patient already had an upcoming appointment within acceptable timeframe, please add "pre-op clearance" to the appointment notes so provider is aware. - Please contact requesting surgeon's office via preferred method (i.e, phone, fax) to inform them of need for appointment prior to surgery.  Patient is not on any antiplatelet or anticoagulation therapy.  Would likely need to stop ibuprofen before procedure but will leave that up to surgeon.  Sharlene Dory, PA-C  09/05/2021, 1:48 PM

## 2021-09-05 NOTE — Telephone Encounter (Signed)
Pt has been scheduled for a tele-visit 09/09/21 10:00.

## 2021-09-05 NOTE — Telephone Encounter (Signed)
   Pre-operative Risk Assessment    Patient Name: [name] [name]  DOB: 1950-09-07 MRN: [id number]      Request for Surgical Clearance    Procedure:   Right Total Arthroplasty  Date of Surgery:  Clearance TBD                                 Surgeon:  Dr. Jodi Geralds Surgeon's Group or Practice Name:  [facility] Phone number:  [phone] Fax number:  [phone]   Type of Clearance Requested:    Medical  & Pharmacy not sure what medication to hold    Type of Anesthesia:  Spinal   Additional requests/questions:    SignedForest Gleason   09/05/2021, 1:29 PM

## 2021-09-05 NOTE — Telephone Encounter (Signed)
Pt has been scheduled for a tele-visit, 09/09/21 10:00, consent on file / medications reconciled.  Pt was unaware of the Chlorthalidone and will discuss during tele-visit.    Patient Consent for Virtual Visit        [name] [name] has provided verbal consent on 09/05/2021 for a virtual visit (video or telephone).   CONSENT FOR VIRTUAL VISIT FOR:  [name] [name]  By participating in this virtual visit I agree to the following:  I hereby voluntarily request, consent and authorize [facility] and its employed or contracted physicians, physician assistants, nurse practitioners or other licensed health care professionals (the Practitioner), to provide me with telemedicine health care services (the "Services") as deemed necessary by the treating Practitioner. I acknowledge and consent to receive the Services by the Practitioner via telemedicine. I understand that the telemedicine visit will involve communicating with the Practitioner through live audiovisual communication technology and the disclosure of certain medical information by electronic transmission. I acknowledge that I have been given the opportunity to request an in-person assessment or other available alternative prior to the telemedicine visit and am voluntarily participating in the telemedicine visit.  I understand that I have the right to withhold or withdraw my consent to the use of telemedicine in the course of my care at any time, without affecting my right to future care or treatment, and that the Practitioner or I may terminate the telemedicine visit at any time. I understand that I have the right to inspect all information obtained and/or recorded in the course of the telemedicine visit and may receive copies of available information for a reasonable fee.  I understand that some of the potential risks of receiving the Services via telemedicine include:  Delay or interruption in medical evaluation due to  technological equipment failure or disruption; Information transmitted may not be sufficient (e.g. poor resolution of images) to allow for appropriate medical decision making by the Practitioner; and/or  In rare instances, security protocols could fail, causing a breach of personal health information.  Furthermore, I acknowledge that it is my responsibility to provide information about my medical history, conditions and care that is complete and accurate to the best of my ability. I acknowledge that Practitioner's advice, recommendations, and/or decision may be based on factors not within their control, such as incomplete or inaccurate data provided by me or distortions of diagnostic images or specimens that may result from electronic transmissions. I understand that the practice of medicine is not an exact science and that Practitioner makes no warranties or guarantees regarding treatment outcomes. I acknowledge that a copy of this consent can be made available to me via my patient portal [facility] MyChart), or I can request a printed copy by calling the office of [facility].    I understand that my insurance will be billed for this visit.   I have read or had this consent read to me. I understand the contents of this consent, which adequately explains the benefits and risks of the Services being provided via telemedicine.  I have been provided ample opportunity to ask questions regarding this consent and the Services and have had my questions answered to my satisfaction. I give my informed consent for the services to be provided through the use of telemedicine in my medical care

## 2021-09-09 ENCOUNTER — Ambulatory Visit (INDEPENDENT_AMBULATORY_CARE_PROVIDER_SITE_OTHER): Payer: Medicare Other | Admitting: Nurse Practitioner

## 2021-09-09 DIAGNOSIS — Z01810 Encounter for preprocedural cardiovascular examination: Secondary | ICD-10-CM

## 2021-09-09 NOTE — Progress Notes (Signed)
      Virtual Visit via Telephone Note    Date:  09/09/2021   Patient ID:  Axel, Meas Aug 12, 1950, MRN [id number] Patient Location:  Home Provider location:   Office  Primary Care Provider:  Shon Hale, MD Primary Cardiologist:  Chilton Si, MD  Multiple attempts made to contact patient for scheduled for preoperative telephone visit. No answer. Left voicemail for patient to return call to reschedule appointment.   Joylene Grapes, NP  09/09/2021, 10:04 AM

## 2021-09-11 ENCOUNTER — Telehealth (HOSPITAL_BASED_OUTPATIENT_CLINIC_OR_DEPARTMENT_OTHER): Payer: Self-pay | Admitting: Cardiovascular Disease

## 2021-09-11 NOTE — Telephone Encounter (Signed)
Patient is calling back to reschedule 07/03 preop tele-visit appt. States she never received the call to discuss clearance. Please advise.

## 2021-09-11 NOTE — Telephone Encounter (Signed)
I s/w the pt today and she has been rescheduled for Tuesday 09/17/21 @ 10 am. Consent given already. See previous notes .

## 2021-09-12 ENCOUNTER — Other Ambulatory Visit: Payer: Self-pay | Admitting: Cardiovascular Disease

## 2021-09-12 NOTE — Telephone Encounter (Signed)
Rx(s) sent to pharmacy electronically.  

## 2021-09-13 DIAGNOSIS — R0902 Hypoxemia: Secondary | ICD-10-CM | POA: Diagnosis not present

## 2021-09-13 DIAGNOSIS — R404 Transient alteration of awareness: Secondary | ICD-10-CM | POA: Diagnosis not present

## 2021-09-13 DIAGNOSIS — I469 Cardiac arrest, cause unspecified: Secondary | ICD-10-CM | POA: Diagnosis not present

## 2021-09-13 DIAGNOSIS — I499 Cardiac arrhythmia, unspecified: Secondary | ICD-10-CM | POA: Diagnosis not present

## 2021-09-16 ENCOUNTER — Encounter: Payer: Self-pay | Admitting: Pharmacist

## 2021-09-16 ENCOUNTER — Telehealth: Payer: Self-pay | Admitting: Pharmacist

## 2021-09-16 NOTE — Telephone Encounter (Signed)
Called pt to follow up with Ozempic tolerability and dose increase and was advised that she passed away on 2022-10-06. Will forward to medical records for pt chart update. Preop visit for 7/11 and f/u with Dr Duke Salvia in Sept have been canceled.

## 2021-09-16 NOTE — Telephone Encounter (Signed)
This encounter was created in error - please disregard.

## 2021-09-17 ENCOUNTER — Telehealth: Payer: Medicare Other

## 2021-10-08 DIAGNOSIS — 419620001 Death: Secondary | SNOMED CT | POA: Diagnosis not present

## 2021-10-08 DEATH — deceased

## 2021-10-14 ENCOUNTER — Other Ambulatory Visit: Payer: Self-pay

## 2021-10-21 ENCOUNTER — Other Ambulatory Visit (HOSPITAL_BASED_OUTPATIENT_CLINIC_OR_DEPARTMENT_OTHER): Payer: Medicare Other | Admitting: Cardiovascular Disease

## 2021-11-20 ENCOUNTER — Other Ambulatory Visit (HOSPITAL_BASED_OUTPATIENT_CLINIC_OR_DEPARTMENT_OTHER): Payer: Self-pay | Admitting: Cardiovascular Disease

## 2021-12-03 ENCOUNTER — Ambulatory Visit (HOSPITAL_BASED_OUTPATIENT_CLINIC_OR_DEPARTMENT_OTHER): Payer: Medicare Other | Admitting: Cardiovascular Disease

## 2023-10-27 IMAGING — CT CT ANGIO ABDOMEN
3 of 13 series · 14 of 46 positions shown, 17 images · IV contrast (APPLIED)
Comparison: CT of the abdomen without contrast on 11/10/1998

CLINICAL DATA: Resistant hypertension. Left adrenal thickening with
possible adenoma on prior CT

EXAM:
CT ANGIOGRAPHY ABDOMEN WITH AND WITHOUT CONTRAST
TECHNIQUE: Multidetector CT imaging of the abdomen was performed using the
standard protocol during bolus administration of intravenous
contrast. Multiplanar reconstructed images and MIPs were obtained
and reviewed to evaluate the vascular anatomy.
CONTRAST:  75mL OMNIPAQUE IOHEXOL 350 MG/ML SOLN

[Series 3: adrenal (person_name) · axial · 0.87mm/px · z∈[-242,-36]mm · 8 of 133 slices shown]
[im 15/133  soft-tissue]
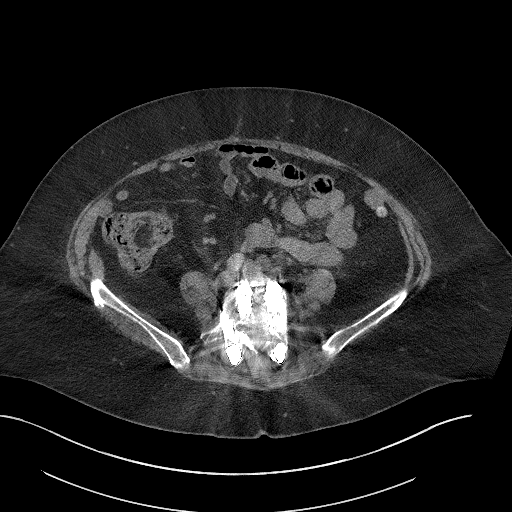
[im 30/133  soft-tissue]
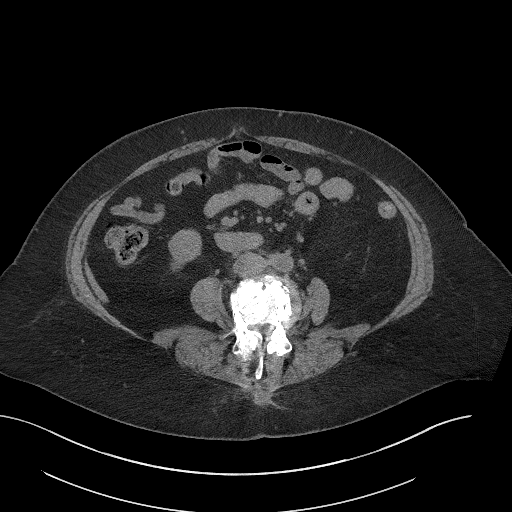
[im 45/133  soft-tissue]
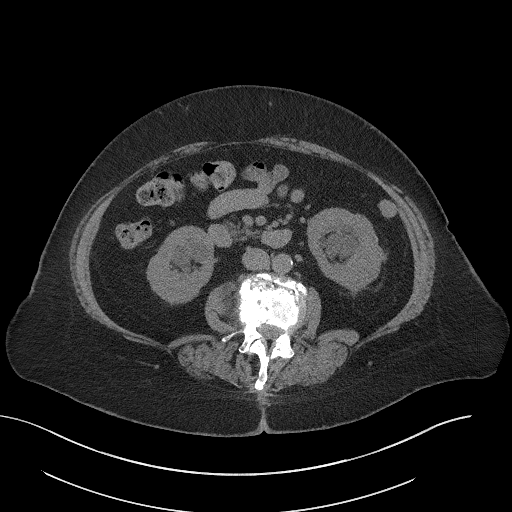
[im 59/133  soft-tissue]
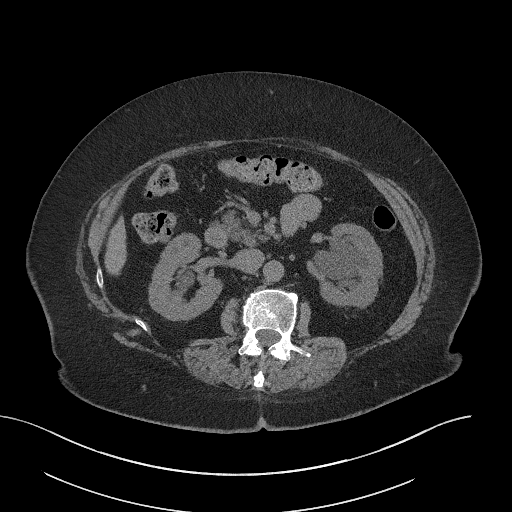
[im 74/133  soft-tissue]
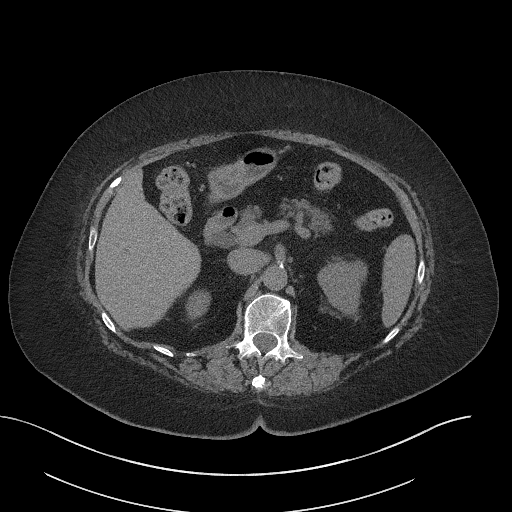
[im 89/133  soft-tissue]
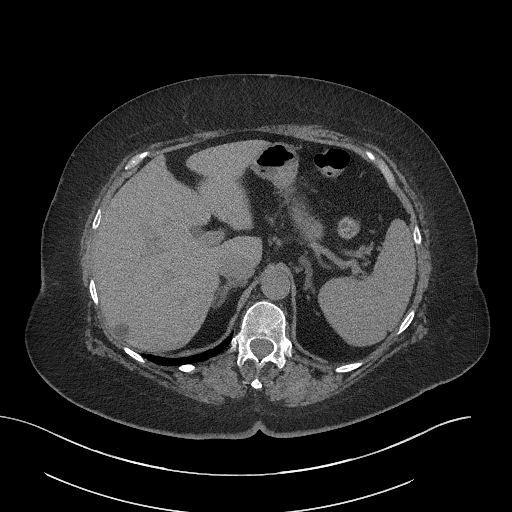
[im 103/133  soft-tissue]
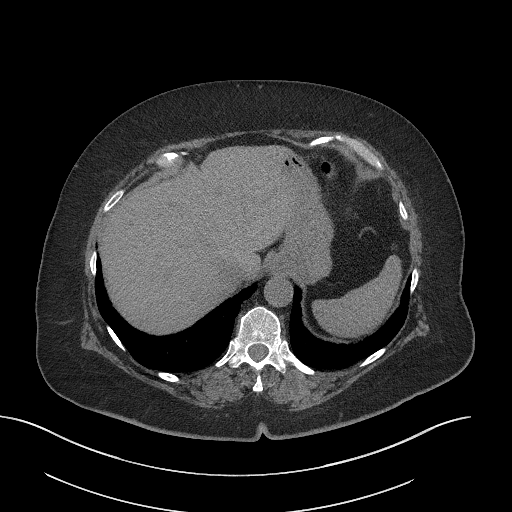
[im 118/133  soft-tissue]
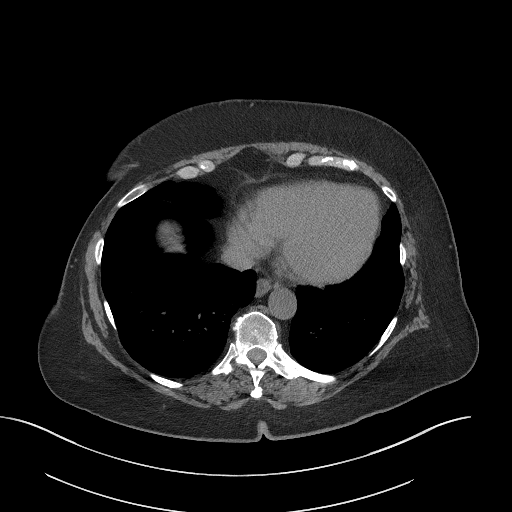

[Series 10: axial arterial (person_name) · axial · arterial · 0.87mm/px · z∈[-246,-156]mm · 4 of 136 slices shown]
[im 16/136  soft-tissue]
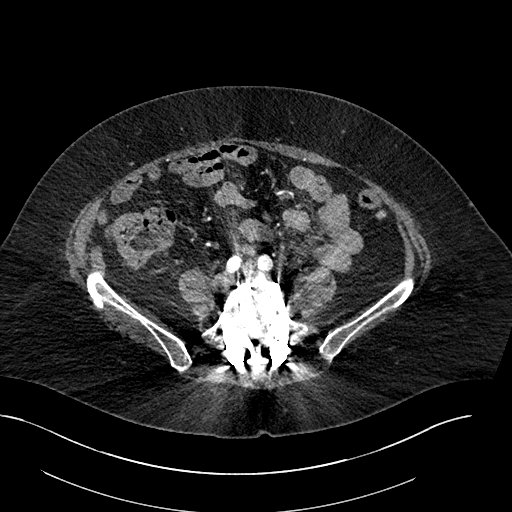
[im 31/136  soft-tissue]
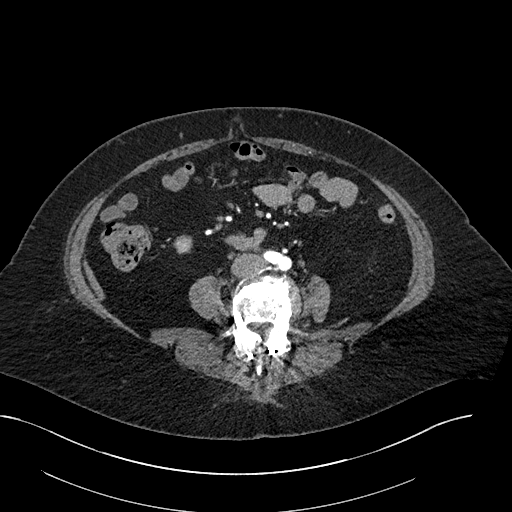
[im 46/136  soft-tissue]
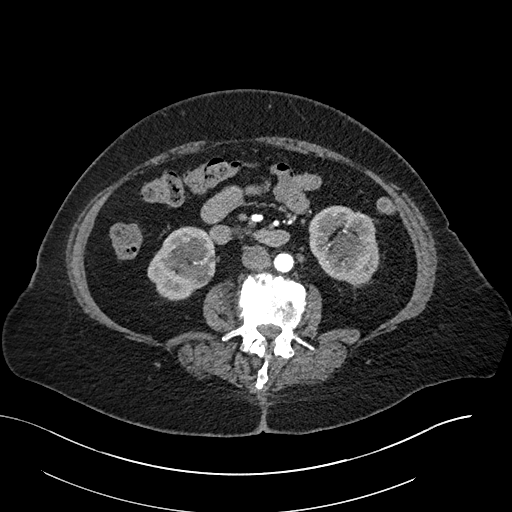
[im 61/136  soft-tissue]
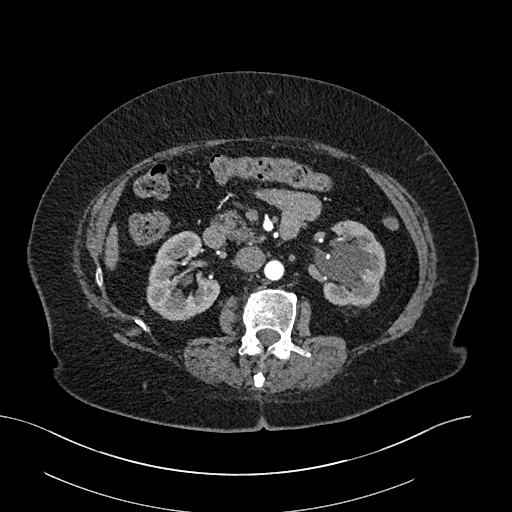

[Series 18: axial venous (person_name) · axial · portal-venous · 0.87mm/px · z∈[-186,-96]mm · 2 of 55 slices shown, 5 images]
[im 19/55  soft-tissue]
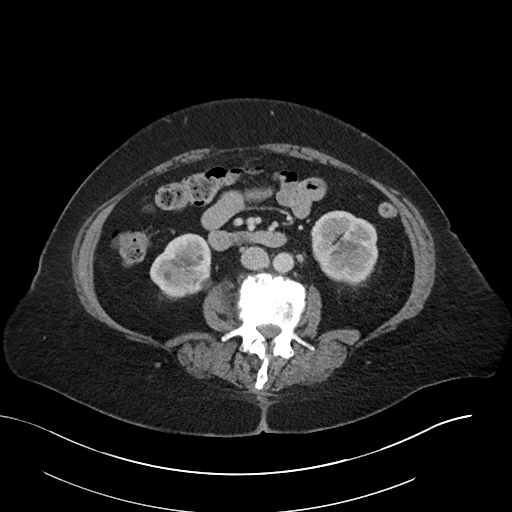
[im 19/55  lung]
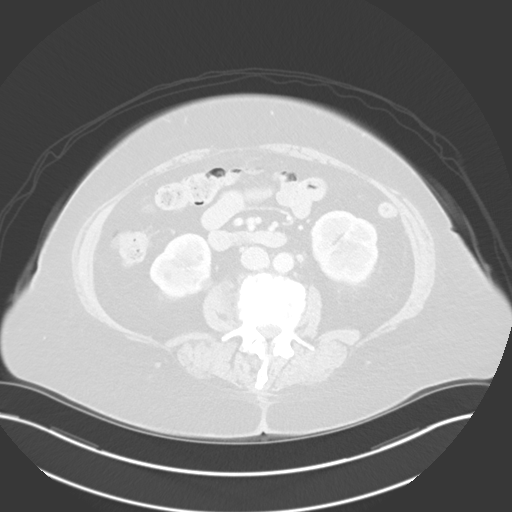
[im 19/55  bone]
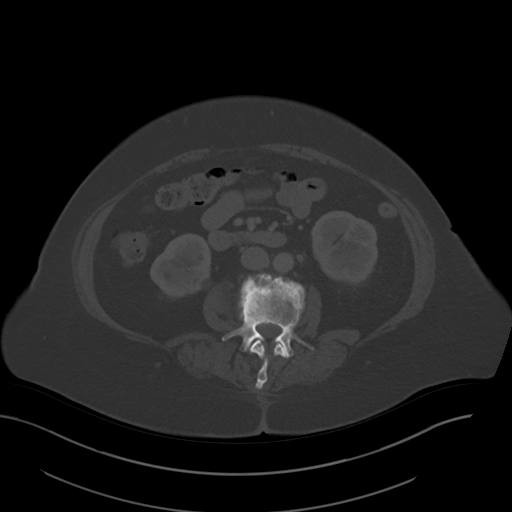
[im 37/55  soft-tissue]
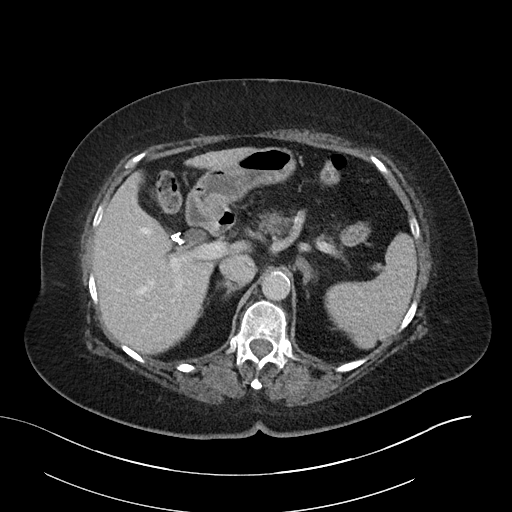
[im 37/55  lung]
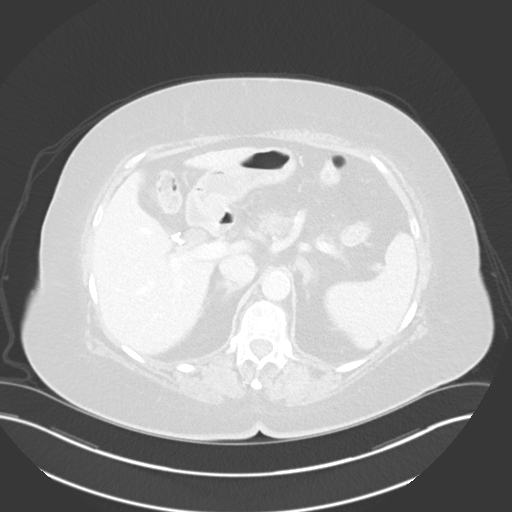

[14 of 46 positions shown; findings below may reference images not displayed]

FINDINGS: VASCULAR

Aorta: Minimal atherosclerosis of the abdominal aorta without
evidence of aneurysm, stenosis or dissection. The distal abdominal
aorta is mildly tortuous.

Celiac: Normally patent with mild calcified plaque at the origin.
Normal branch vessel anatomy and patency.

SMA: Mild calcified plaque at the SMA origin without evidence
significant stenosis. Visualized distal branch vessels are normally
patent.

Renals: Bilateral single renal arteries demonstrate normal patency.
Branch vessels are well visualized into the kidneys bilaterally and
demonstrate no evidence of stenosis or fibromuscular dysplasia.

IMA: Normally patent.

Inflow: Visualized bilateral iliac arteries demonstrate normal
patency without aneurysm or stenosis.

Veins: Venous phase imaging demonstrates normal patency of venous
structures including the IVC, hepatic veins, bilateral renal veins
and common iliac veins. The portal vein, splenic vein and visualized
mesenteric veins are normally patent.

Review of the MIP images confirms the above findings.

NON-VASCULAR

Lower chest: No acute abnormality.

Hepatobiliary: Stable 12 mm rounded cyst in the posterior
subcapsular right lobe of the liver that has a benign appearance.
This shows no enhancement on arterial and venous phases of imaging.
The rest of the liver is normal. The gallbladder has been removed.
No biliary ductal dilatation identified.

Pancreas: Unremarkable. No pancreatic ductal dilatation or
surrounding inflammatory changes.

Spleen: Normal in size without focal abnormality.

Adrenals/Urinary Tract: No adrenal mass is identified. The left
adrenal gland continues to have a mildly hyperplastic appearance
without evidence of focal lesion. Bilateral parapelvic renal cysts
have a benign appearance with more prominent cysts in the left
parapelvic region. There remains a nonobstructing calculus in the
central left kidney which shows slight enlargement since the prior
CT and measures approximately 4 mm in greatest diameter.

Stomach/Bowel: Visualized bowel demonstrates no evidence of
obstruction, ileus, inflammation or lesion. No free intraperitoneal
air identified.

Lymphatic: No enlarged lymph nodes identified.

Other: No hernias, ascites or focal fluid collections.

Musculoskeletal: No acute or significant osseous findings.
IMPRESSION: 1. Normally patent renal arteries without evidence of renal artery
stenosis bilaterally. No evidence of fibromuscular dysplasia.
2. Previously noted thickened appearance of the left adrenal gland
again identified with the contrast enhanced study demonstrating no
evidence of any adrenal lesions. The left adrenal gland has a mildly
hyperplastic appearance.
3. Stable 12 mm rounded cyst in the posterior subcapsular right lobe
of the liver having a benign appearance.
4. Slight enlargement of a nonobstructing calculus in the central
left renal collecting system measuring approximately 4 mm.

## 2023-12-24 IMAGING — US US EXTREM  UP VENOUS*L*
1 series · 13 of 24 positions shown · non-contrast
Comparison: None.

CLINICAL DATA: Left forearm pain at site of prior IV



[Series 1: us venous img upper uni left (dvt) · portal-venous · 13 of 30 slices shown]
[im 1/30]
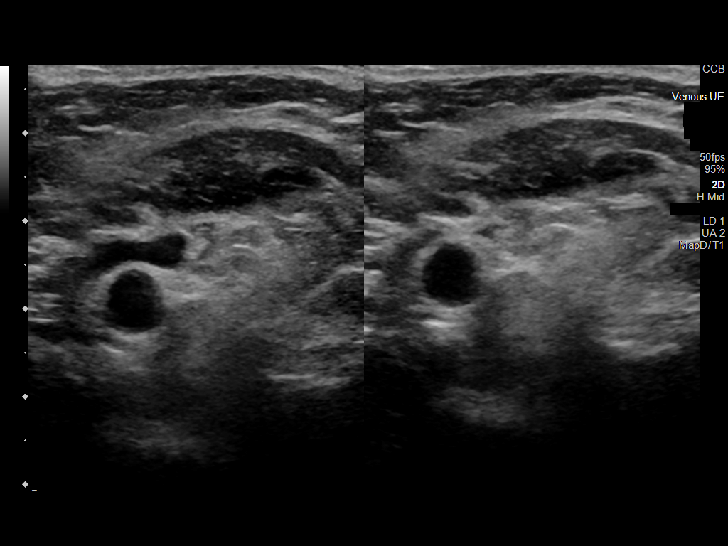
[im 3/30]
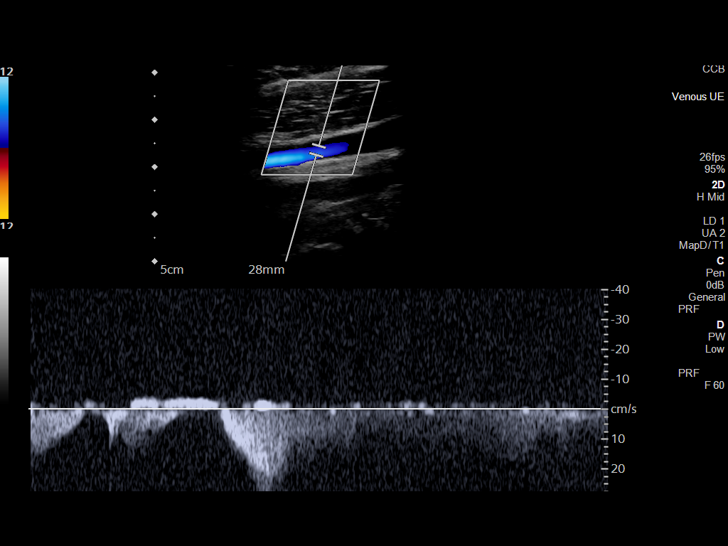
[im 6/30]
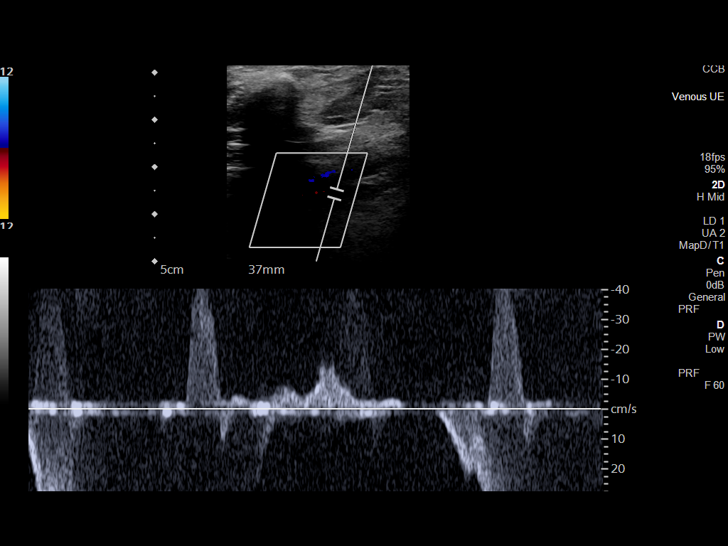
[im 8/30]
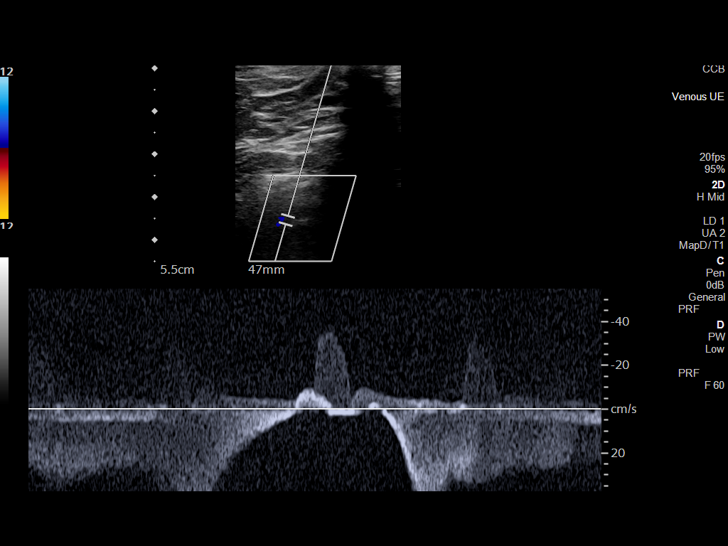
[im 11/30]
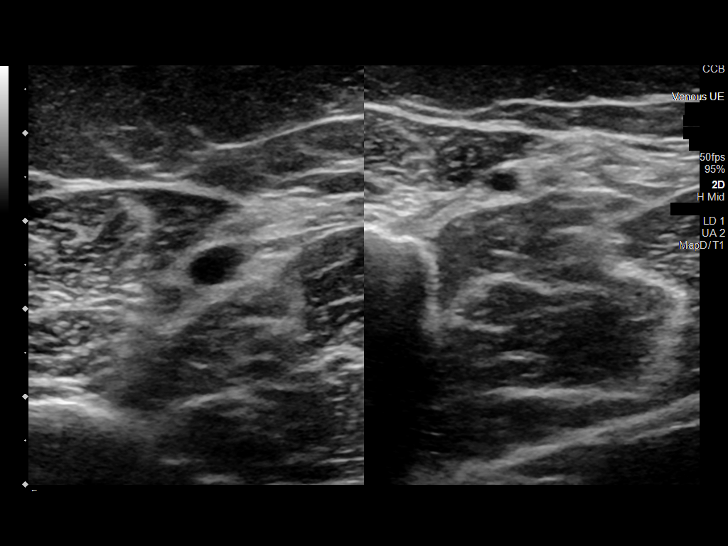
[im 13/30]
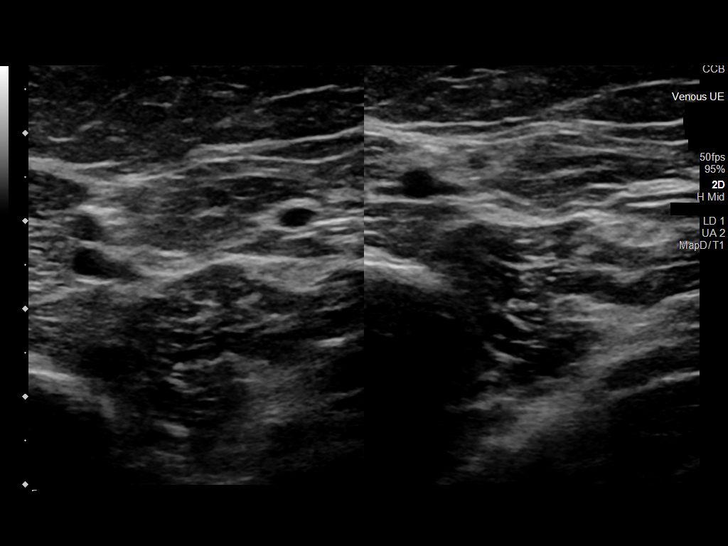
[im 16/30]
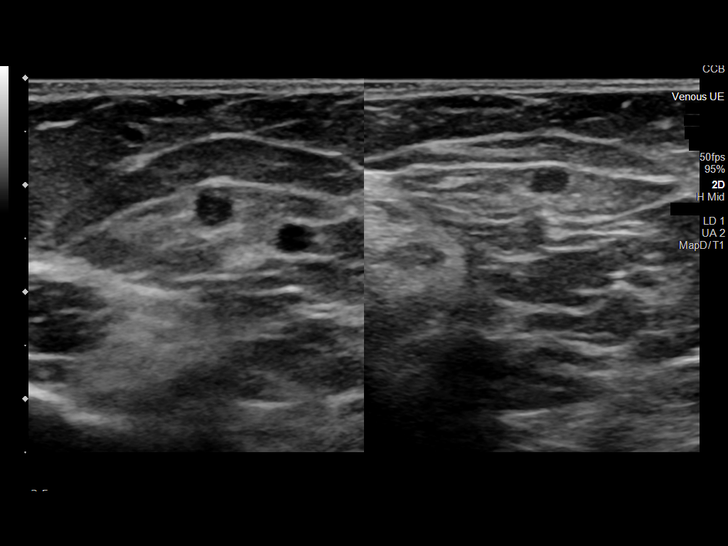
[im 17/30]
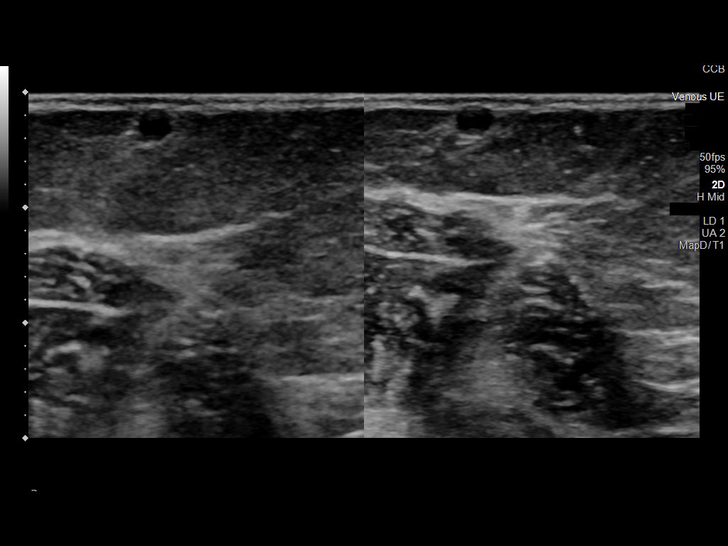
[im 19/30]
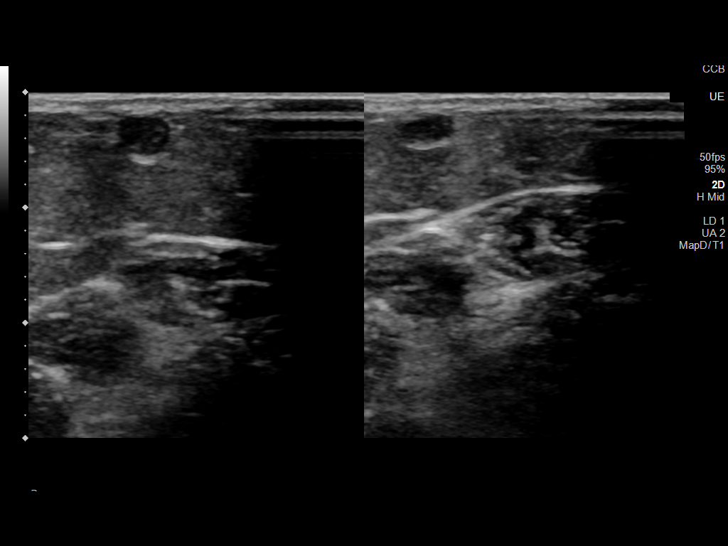
[im 22/30]
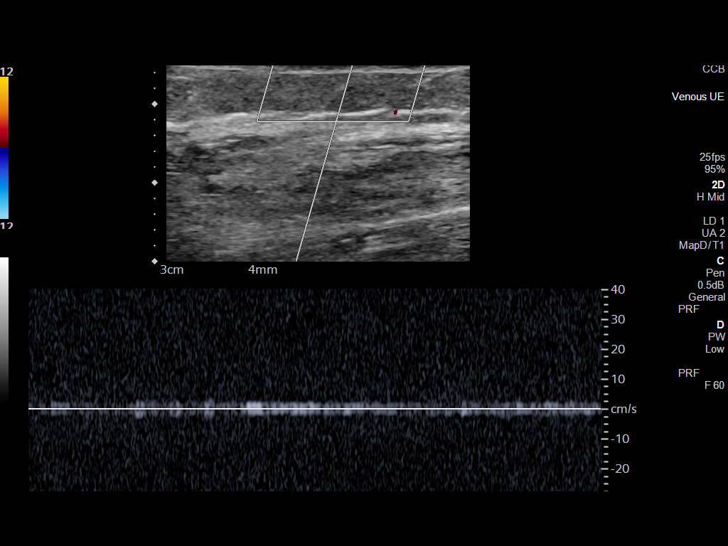
[im 24/30]
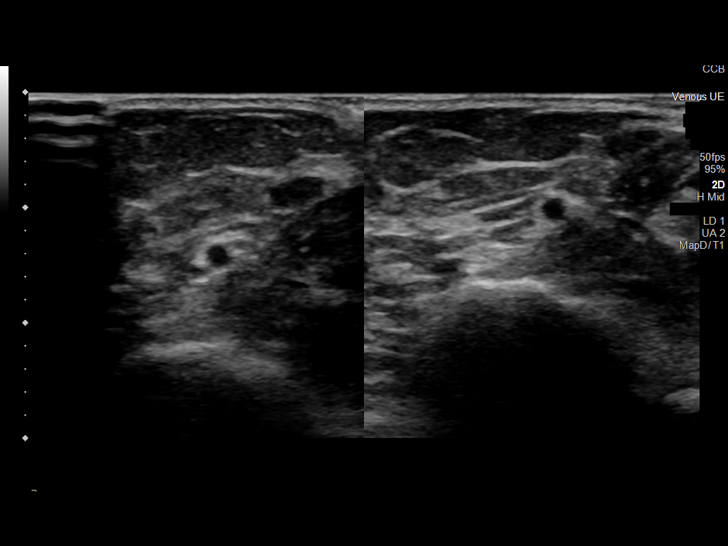
[im 27/30]
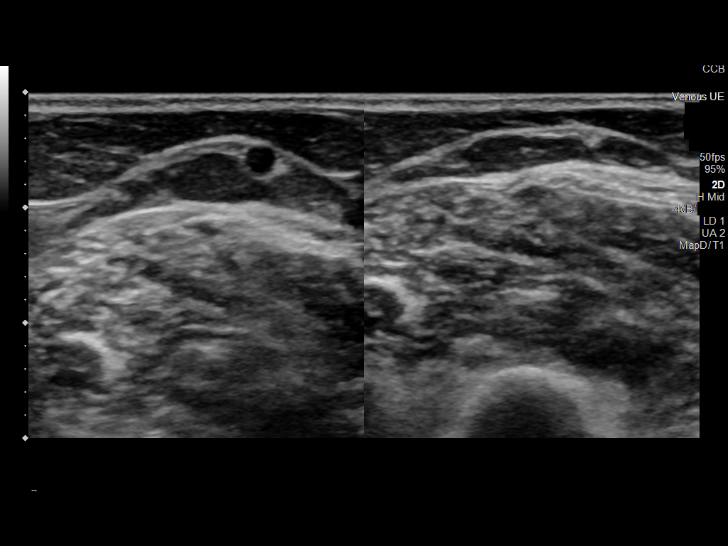
[im 30/30]
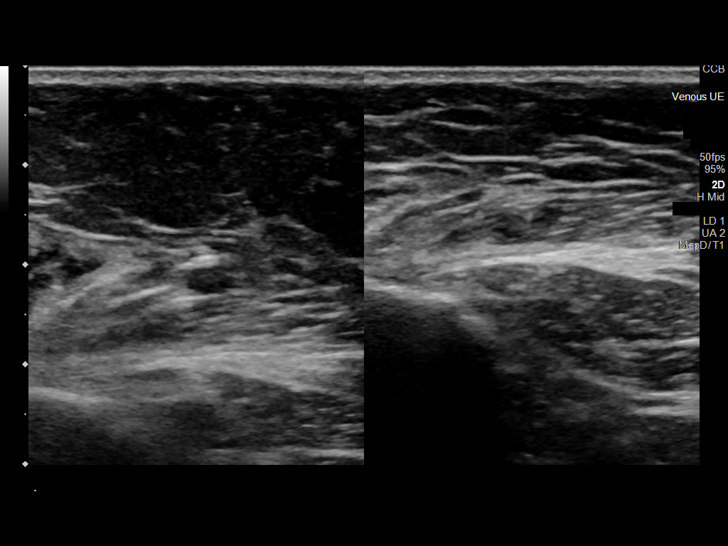

[13 of 24 positions shown; findings below may reference images not displayed]

FINDINGS: Contralateral Subclavian Vein: Respiratory phasicity is normal and
symmetric with the symptomatic side. No evidence of thrombus. Normal
compressibility.

Internal Jugular Vein: No evidence of thrombus. Normal
compressibility, respiratory phasicity and response to augmentation.

Subclavian Vein: No evidence of thrombus. Normal compressibility,
respiratory phasicity and response to augmentation.

Axillary Vein: No evidence of thrombus. Normal compressibility,
respiratory phasicity and response to augmentation.

Cephalic Vein: No evidence of thrombus. Normal compressibility,
respiratory phasicity and response to augmentation.

Basilic Vein: Positive for focal occlusion of a superficial branch
of the basilic vein in the proximal forearm. Thrombus is relatively
echogenic suggesting a subacute etiology. No extension into the deep
system.

Brachial Veins: No evidence of thrombus. Normal compressibility,
respiratory phasicity and response to augmentation.

Radial Veins: No evidence of thrombus. Normal compressibility,
respiratory phasicity and response to augmentation.

Ulnar Veins: No evidence of thrombus. Normal compressibility,
respiratory phasicity and response to augmentation.

Venous Reflux:  None visualized.

Other Findings:  None visualized.
IMPRESSION: 1. Positive for focal superficial subacute thrombosis of a branch of
the basilic vein in the proximal forearm.
2. No evidence of deep venous thrombosis.
